# Patient Record
Sex: Male | Born: 1990 | Race: Black or African American | Hispanic: No | Marital: Single | Smoking: Never smoker
Health system: Southern US, Community
[De-identification: ages and names within clinical notes are randomized; demographics above are authoritative.]

---

## 2021-10-15 ENCOUNTER — Inpatient Hospital Stay
Admission: EM | Admit: 2021-10-15 | Discharge: 2021-10-18 | DRG: 638 | Disposition: A | Discharge status: Home / Self Care | Payer: Self-pay | Attending: Internal Medicine | Admitting: Internal Medicine

## 2021-10-15 ENCOUNTER — Emergency Department: Payer: Self-pay

## 2021-10-15 ENCOUNTER — Encounter: Payer: Self-pay | Admitting: Emergency Medicine

## 2021-10-15 ENCOUNTER — Other Ambulatory Visit: Payer: Self-pay

## 2021-10-15 DIAGNOSIS — E669 Obesity, unspecified: Secondary | ICD-10-CM | POA: Diagnosis present

## 2021-10-15 DIAGNOSIS — N179 Acute kidney failure, unspecified: Secondary | ICD-10-CM | POA: Diagnosis present

## 2021-10-15 DIAGNOSIS — Z20822 Contact with and (suspected) exposure to covid-19: Secondary | ICD-10-CM | POA: Diagnosis present

## 2021-10-15 DIAGNOSIS — Z6838 Body mass index (BMI) 38.0-38.9, adult: Secondary | ICD-10-CM

## 2021-10-15 DIAGNOSIS — R112 Nausea with vomiting, unspecified: Secondary | ICD-10-CM

## 2021-10-15 DIAGNOSIS — D72829 Elevated white blood cell count, unspecified: Secondary | ICD-10-CM

## 2021-10-15 DIAGNOSIS — R1013 Epigastric pain: Secondary | ICD-10-CM

## 2021-10-15 DIAGNOSIS — E1110 Type 2 diabetes mellitus with ketoacidosis without coma: Principal | ICD-10-CM | POA: Diagnosis present

## 2021-10-15 DIAGNOSIS — E860 Dehydration: Secondary | ICD-10-CM

## 2021-10-15 DIAGNOSIS — E1310 Other specified diabetes mellitus with ketoacidosis without coma: Secondary | ICD-10-CM

## 2021-10-15 DIAGNOSIS — K5900 Constipation, unspecified: Secondary | ICD-10-CM

## 2021-10-15 LAB — BLOOD GAS, VENOUS
Acid-base deficit: 18.3 mmol/L — ABNORMAL HIGH (ref 0.0–2.0)
Bicarbonate: 9.8 mmol/L — ABNORMAL LOW (ref 20.0–28.0)
O2 Saturation: 52.6 %
Patient temperature: 37
pCO2, Ven: 30 mmHg — ABNORMAL LOW (ref 44–60)
pH, Ven: 7.12 — CL (ref 7.25–7.43)
pO2, Ven: 32 mmHg (ref 32–45)

## 2021-10-15 LAB — MAGNESIUM: Magnesium: 2.3 mg/dL (ref 1.7–2.4)

## 2021-10-15 LAB — URINALYSIS, ROUTINE W REFLEX MICROSCOPIC
Bacteria, UA: NONE SEEN
Bilirubin Urine: NEGATIVE
Glucose, UA: >=500 mg/dL — AB
Ketones, ur: 80 mg/dL — AB
Leukocytes,Ua: NEGATIVE
Nitrite: NEGATIVE
Protein, ur: 100 mg/dL — AB
Specific Gravity, Urine: 1.026 (ref 1.005–1.030)
Squamous Epithelial / HPF: NONE SEEN (ref 0–5)
pH: 5 (ref 5.0–8.0)

## 2021-10-15 LAB — COMPREHENSIVE METABOLIC PANEL
ALT: 26 U/L (ref 0–44)
AST: 21 U/L (ref 15–41)
Albumin: 4.1 g/dL (ref 3.5–5.0)
Alkaline Phosphatase: 94 U/L (ref 38–126)
Anion gap: 20 — ABNORMAL HIGH (ref 5–15)
BUN: 14 mg/dL (ref 6–20)
CO2: 11 mmol/L — ABNORMAL LOW (ref 22–32)
Calcium: 8.6 mg/dL — ABNORMAL LOW (ref 8.9–10.3)
Chloride: 100 mmol/L (ref 98–111)
Creatinine, Ser: 1.54 mg/dL — ABNORMAL HIGH (ref 0.61–1.24)
GFR, Estimated: >60 mL/min (ref >60)
Glucose, Bld: 352 mg/dL — ABNORMAL HIGH (ref 70–99)
Potassium: 4.6 mmol/L (ref 3.5–5.1)
Sodium: 131 mmol/L — ABNORMAL LOW (ref 135–145)
Total Bilirubin: 1.9 mg/dL — ABNORMAL HIGH (ref 0.3–1.2)
Total Protein: 7.9 g/dL (ref 6.5–8.1)

## 2021-10-15 LAB — CBC
HCT: 47.2 % (ref 39.0–52.0)
Hemoglobin: 15.7 g/dL (ref 13.0–17.0)
MCH: 28 pg (ref 26.0–34.0)
MCHC: 33.3 g/dL (ref 30.0–36.0)
MCV: 84.1 fL (ref 80.0–100.0)
Platelets: 295 10*3/uL (ref 150–400)
RBC: 5.61 MIL/uL (ref 4.22–5.81)
RDW: 13.1 % (ref 11.5–15.5)
WBC: 10.7 10*3/uL — ABNORMAL HIGH (ref 4.0–10.5)
nRBC: 0 % (ref 0.0–0.2)

## 2021-10-15 LAB — RESP PANEL BY RT-PCR (FLU A&B, COVID) ARPGX2
Influenza A by PCR: NEGATIVE
Influenza B by PCR: NEGATIVE
SARS Coronavirus 2 by RT PCR: NEGATIVE

## 2021-10-15 LAB — LIPASE, BLOOD: Lipase: 27 U/L (ref 11–51)

## 2021-10-15 LAB — BETA-HYDROXYBUTYRIC ACID: Beta-Hydroxybutyric Acid: >8 mmol/L — ABNORMAL HIGH (ref 0.05–0.27)

## 2021-10-15 LAB — CBG MONITORING, ED: Glucose-Capillary: 85 mg/dL (ref 70–99)

## 2021-10-15 MED ORDER — SODIUM CHLORIDE 0.9 % IV SOLN: INTRAVENOUS | Status: DC

## 2021-10-15 MED ORDER — ACETAMINOPHEN 325 MG PO TABS: 650.0000 mg | ORAL_TABLET | Freq: Four times a day (QID) | ORAL | Status: DC | PRN

## 2021-10-15 MED ORDER — DEXTROSE 50 % IV SOLN: 0.0000 mL | INTRAVENOUS | Status: DC | PRN

## 2021-10-15 MED ORDER — ACETAMINOPHEN 650 MG RE SUPP: 650.0000 mg | Freq: Four times a day (QID) | RECTAL | Status: DC | PRN

## 2021-10-15 MED ORDER — NALOXONE HCL 2 MG/2ML IJ SOSY
0.4000 mg | PREFILLED_SYRINGE | INTRAMUSCULAR | Status: DC | PRN
  Filled 2021-10-15: qty 2

## 2021-10-15 MED ORDER — FENTANYL CITRATE PF 50 MCG/ML IJ SOSY
25.0000 ug | PREFILLED_SYRINGE | INTRAMUSCULAR | Status: DC | PRN
  Administered 2021-10-16 (×2): 25 ug via INTRAVENOUS
  Filled 2021-10-15 (×2): qty 1

## 2021-10-15 MED ORDER — POTASSIUM CHLORIDE 10 MEQ/100ML IV SOLN
10.0000 meq | INTRAVENOUS | Status: AC
  Administered 2021-10-15 – 2021-10-16 (×2): 10 meq via INTRAVENOUS
  Filled 2021-10-15 (×2): qty 100

## 2021-10-15 MED ORDER — SODIUM CHLORIDE 0.9 % IV BOLUS
1000.0000 mL | INTRAVENOUS | Status: AC
  Administered 2021-10-15: 1000 mL via INTRAVENOUS

## 2021-10-15 MED ORDER — SODIUM CHLORIDE 0.9 % IV BOLUS
1000.0000 mL | Freq: Once | INTRAVENOUS | Status: AC
  Administered 2021-10-15: 1000 mL via INTRAVENOUS

## 2021-10-15 MED ORDER — ONDANSETRON HCL 4 MG/2ML IJ SOLN
4.0000 mg | Freq: Four times a day (QID) | INTRAMUSCULAR | Status: DC | PRN
  Administered 2021-10-16 (×2): 4 mg via INTRAVENOUS
  Filled 2021-10-15 (×2): qty 2

## 2021-10-15 MED ORDER — DEXTROSE IN LACTATED RINGERS 5 % IV SOLN: INTRAVENOUS | Status: DC

## 2021-10-15 MED ORDER — INSULIN REGULAR(HUMAN) IN NACL 100-0.9 UT/100ML-% IV SOLN
INTRAVENOUS | Status: DC
  Administered 2021-10-15: 5.5 [IU]/h via INTRAVENOUS
  Filled 2021-10-15: qty 100

## 2021-10-15 NOTE — ED Provider Notes (Signed)
[facility] Provider Note    Event Date/Time   First MD Initiated Contact with Patient 10/15/21 2136     (approximate)   History   Emesis and Abdominal Pain   HPI  Mark Mayer is a 31 y.o. male reports no significant past medical history   Reports no primary care doctor or recent hospitalizations.  Takes no medications does not use alcohol or smoke  For about 5 to 6 days he has been having nausea, also very thirsty urinating frequently.  Reports moderate pain but severe nausea in his abdomen primary in the upper to right upper abdomen  No chest pain no trouble breathing.  Does feel fatigued lightheaded feels like his arms and legs feel heavy in general and dehydrated very thirsty  Urinating lots but no pain or discomfort with urination  No fever  No headache  Physical Exam   Triage Vital Signs: ED Triage Vitals  Enc Vitals Group     BP 10/15/21 1933 140/88     Pulse Rate 10/15/21 1933 (!) 121     Resp 10/15/21 1933 20     Temp 10/15/21 1933 98.7 F (37.1 C)     Temp Source 10/15/21 1933 Oral     SpO2 10/15/21 1929 100 %     Weight 10/15/21 1930 240 lb (108.9 kg)     Height 10/15/21 1930 5\' 6"  (1.676 m)     Head Circumference --      Peak Flow --      Pain Score 10/15/21 1930 7     Pain Loc --      Pain Edu? --      Excl. in GC? --     Most recent vital signs: Vitals:   10/15/21 1933 10/15/21 2230  BP: 140/88 (!) 137/113  Pulse: (!) 121 (!) 122  Resp: 20   Temp: 98.7 F (37.1 C)   SpO2: 97% 98%     General: Awake, no distress.  Appears mildly ill.  Somewhat tachycardic, slightly diaphoretic.  Mucous membranes dry CV:  Good peripheral perfusion.  Tachycardia.  Normal heart tones Resp:  Normal effort.  No noted tachypnea.  No clues [place] breathing Abd:  No distention.  reorts moderate pain to palpation epigastrium right upper quadrant mild pain in the right lower quadrant.  Denies pain across the left side of the  abdomen Other:  Dry mucous membranes.  Fully alert well oriented.  Moves all extremities well follows commands without difficulty.   ED Results / Procedures / Treatments   Labs (all labs ordered are listed, but only abnormal results are displayed) Labs Reviewed  COMPREHENSIVE METABOLIC PANEL - Abnormal; Notable for the following components:      Result Value   Sodium 131 (*)    CO2 11 (*)    Glucose, Bld 352 (*)    Creatinine, Ser 1.54 (*)    Calcium 8.6 (*)    Total Bilirubin 1.9 (*)    Anion gap 20 (*)    All other components within normal limits  CBC - Abnormal; Notable for the following components:   WBC 10.7 (*)    All other components within normal limits  URINALYSIS, ROUTINE W REFLEX MICROSCOPIC - Abnormal; Notable for the following components:   Color, Urine STRAW (*)    APPearance CLEAR (*)    Glucose, UA >=500 (*)    Hgb urine dipstick MODERATE (*)    Ketones, ur 80 (*)    Protein, ur 100 (*)  All other components within normal limits  BLOOD GAS, VENOUS - Abnormal; Notable for the following components:   pH, Ven 7.12 (*)    pCO2, Ven 30 (*)    Bicarbonate 9.8 (*)    Acid-base deficit 18.3 (*)    All other components within normal limits  BETA-HYDROXYBUTYRIC ACID - Abnormal; Notable for the following components:   Beta-Hydroxybutyric Acid >8.00 (*)    All other components within normal limits  RESP PANEL BY RT-PCR (FLU A&B, COVID) ARPGX2  LIPASE, BLOOD  HIV ANTIBODY (ROUTINE TESTING W REFLEX)  MAGNESIUM  MAGNESIUM  COMPREHENSIVE METABOLIC PANEL  CBC WITH DIFFERENTIAL/PLATELET  PHOSPHORUS  BASIC METABOLIC PANEL  BASIC METABOLIC PANEL  CBG MONITORING, ED   Labs are personally reviewed and interpreted by me, notable for significant acidosis reduced CO2 elevated glucose elevated ketones.  Findings concerning in keeping with likely acute new onset diabetic ketoacidosis   RADIOLOGY  Personally viewed and interpreted the patient's CT abdomen for gross  pathology.  I do not see evidence of acute gross abnormality.  Refer to radiology for more detailed report     PROCEDURES:  Critical Care performed: Yes, see critical care procedure note(s)  Procedures  CRITICAL CARE Performed by: Sharyn Creamer   Total critical care time: 30 minutes  Critical care time was exclusive of separately billable procedures and treating other patients.  Critical care was necessary to treat or prevent imminent or life-threatening deterioration.  Critical care was time spent personally by me on the following activities: development of treatment plan with patient and/or surrogate as well as nursing, discussions with consultants, evaluation of patient's response to treatment, examination of patient, obtaining history from patient or surrogate, ordering and performing treatments and interventions, ordering and review of laboratory studies, ordering and review of radiographic studies, pulse oximetry and re-evaluation of patient's condition.   MEDICATIONS ORDERED IN ED: Medications  insulin regular, human (MYXREDLIN) 100 units/ 100 mL infusion (has no administration in time range)  dextrose 5 % in lactated ringers infusion (has no administration in time range)  dextrose 50 % solution 0-50 mL (has no administration in time range)  sodium chloride 0.9 % bolus 1,000 mL (has no administration in time range)  0.9 %  sodium chloride infusion (has no administration in time range)  potassium chloride 10 mEq in 100 mL IVPB (has no administration in time range)  acetaminophen (TYLENOL) tablet 650 mg (has no administration in time range)    Or  acetaminophen (TYLENOL) suppository 650 mg (has no administration in time range)  naloxone (NARCAN) injection 0.4 mg (has no administration in time range)  fentaNYL (SUBLIMAZE) injection 25 mcg (has no administration in time range)  ondansetron (ZOFRAN) injection 4 mg (has no administration in time range)  sodium chloride 0.9 % bolus  1,000 mL (1,000 mLs Intravenous New Bag/Given 10/15/21 2218)     IMPRESSION / MDM / ASSESSMENT AND PLAN / ED COURSE  I reviewed the triage vital signs and the nursing notes.                              Differential diagnosis includes, but is not limited to, new onset diabetic ketoacidosis based on his labs and clinical history.  Abdominal pain may be referred secondary to his DKA, however given his presentation do wish to exclude acute pathologies.  Given his elevated creatinine and concern for DKA and dehydration will avoid nephrotoxic agents at this time.  CT  scan without contrast shows no acute finding.  Patient feeling improved after receiving fluids, will start on DKA protocol/Endo tool  Admission discussed with Dr. Marilynn Rail.   The patient is on the cardiac monitor to evaluate for evidence of arrhythmia and/or significant heart rate changes.  ----------------------------------------- 11:11 PM on 10/15/2021 ----------------------------------------- Patient understanding and agreeable with plan for admission to hospital.  Started to feel improved after receiving fluids, starting on insulin infusion by protocol.       FINAL CLINICAL IMPRESSION(S) / ED DIAGNOSES   Final diagnoses:  Diabetic ketoacidosis without coma associated with other specified diabetes mellitus (HCC)     Rx / DC Orders   ED Discharge Orders     None        Note:  This document was prepared using Dragon voice recognition software and may include unintentional dictation errors.   Sharyn Creamer, MD 10/15/21 [phone]

## 2021-10-15 NOTE — ED Triage Notes (Signed)
Pt presents tonight with complaints of N/V x 3 days with associated RUQ pain that radiates to the left side of his stomach. He states that he hasn't had a BM in almost a week and endorses fullness in his abdomen. Pt received 4mg  of zofran & of NS with EMS en route. Denies SOB or CP.

## 2021-10-15 NOTE — ED Notes (Signed)
Iv fluids infusing.  Pt moved to room 5.  Family with pt.

## 2021-10-15 NOTE — ED Notes (Signed)
Md in with pt and family 

## 2021-10-16 ENCOUNTER — Observation Stay: Payer: Self-pay

## 2021-10-16 ENCOUNTER — Encounter: Payer: Self-pay | Admitting: Internal Medicine

## 2021-10-16 DIAGNOSIS — R112 Nausea with vomiting, unspecified: Secondary | ICD-10-CM | POA: Diagnosis present

## 2021-10-16 DIAGNOSIS — D72829 Elevated white blood cell count, unspecified: Secondary | ICD-10-CM | POA: Diagnosis present

## 2021-10-16 DIAGNOSIS — E860 Dehydration: Secondary | ICD-10-CM | POA: Diagnosis present

## 2021-10-16 DIAGNOSIS — R1013 Epigastric pain: Secondary | ICD-10-CM | POA: Diagnosis present

## 2021-10-16 DIAGNOSIS — N179 Acute kidney failure, unspecified: Secondary | ICD-10-CM | POA: Diagnosis present

## 2021-10-16 LAB — BASIC METABOLIC PANEL
Anion gap: 11 (ref 5–15)
Anion gap: 13 (ref 5–15)
Anion gap: 23 — ABNORMAL HIGH (ref 5–15)
BUN: 10 mg/dL (ref 6–20)
BUN: 11 mg/dL (ref 6–20)
BUN: 13 mg/dL (ref 6–20)
CO2: 11 mmol/L — ABNORMAL LOW (ref 22–32)
CO2: 13 mmol/L — ABNORMAL LOW (ref 22–32)
CO2: 8 mmol/L — ABNORMAL LOW (ref 22–32)
Calcium: 8.4 mg/dL — ABNORMAL LOW (ref 8.9–10.3)
Calcium: 8.5 mg/dL — ABNORMAL LOW (ref 8.9–10.3)
Calcium: 8.6 mg/dL — ABNORMAL LOW (ref 8.9–10.3)
Chloride: 102 mmol/L (ref 98–111)
Chloride: 107 mmol/L (ref 98–111)
Chloride: 109 mmol/L (ref 98–111)
Creatinine, Ser: 1.42 mg/dL — ABNORMAL HIGH (ref 0.61–1.24)
Creatinine, Ser: 1.44 mg/dL — ABNORMAL HIGH (ref 0.61–1.24)
Creatinine, Ser: 1.49 mg/dL — ABNORMAL HIGH (ref 0.61–1.24)
GFR, Estimated: >60 mL/min (ref >60)
GFR, Estimated: >60 mL/min (ref >60)
GFR, Estimated: >60 mL/min (ref >60)
Glucose, Bld: 209 mg/dL — ABNORMAL HIGH (ref 70–99)
Glucose, Bld: 266 mg/dL — ABNORMAL HIGH (ref 70–99)
Glucose, Bld: 303 mg/dL — ABNORMAL HIGH (ref 70–99)
Potassium: 4 mmol/L (ref 3.5–5.1)
Potassium: 4.1 mmol/L (ref 3.5–5.1)
Potassium: 4.8 mmol/L (ref 3.5–5.1)
Sodium: 131 mmol/L — ABNORMAL LOW (ref 135–145)
Sodium: 133 mmol/L — ABNORMAL LOW (ref 135–145)
Sodium: 133 mmol/L — ABNORMAL LOW (ref 135–145)

## 2021-10-16 LAB — COMPREHENSIVE METABOLIC PANEL
ALT: 23 U/L (ref 0–44)
AST: 16 U/L (ref 15–41)
Albumin: 3.8 g/dL (ref 3.5–5.0)
Alkaline Phosphatase: 87 U/L (ref 38–126)
Anion gap: 17 — ABNORMAL HIGH (ref 5–15)
BUN: 12 mg/dL (ref 6–20)
CO2: 10 mmol/L — ABNORMAL LOW (ref 22–32)
Calcium: 8.2 mg/dL — ABNORMAL LOW (ref 8.9–10.3)
Chloride: 109 mmol/L (ref 98–111)
Creatinine, Ser: 1.46 mg/dL — ABNORMAL HIGH (ref 0.61–1.24)
GFR, Estimated: >60 mL/min (ref >60)
Glucose, Bld: 230 mg/dL — ABNORMAL HIGH (ref 70–99)
Potassium: 4.7 mmol/L (ref 3.5–5.1)
Sodium: 136 mmol/L (ref 135–145)
Total Bilirubin: 1 mg/dL (ref 0.3–1.2)
Total Protein: 7.8 g/dL (ref 6.5–8.1)

## 2021-10-16 LAB — CBG MONITORING, ED
Glucose-Capillary: 180 mg/dL — ABNORMAL HIGH (ref 70–99)
Glucose-Capillary: 190 mg/dL — ABNORMAL HIGH (ref 70–99)
Glucose-Capillary: 201 mg/dL — ABNORMAL HIGH (ref 70–99)
Glucose-Capillary: 208 mg/dL — ABNORMAL HIGH (ref 70–99)
Glucose-Capillary: 208 mg/dL — ABNORMAL HIGH (ref 70–99)
Glucose-Capillary: 236 mg/dL — ABNORMAL HIGH (ref 70–99)
Glucose-Capillary: 239 mg/dL — ABNORMAL HIGH (ref 70–99)
Glucose-Capillary: 253 mg/dL — ABNORMAL HIGH (ref 70–99)
Glucose-Capillary: 255 mg/dL — ABNORMAL HIGH (ref 70–99)
Glucose-Capillary: 265 mg/dL — ABNORMAL HIGH (ref 70–99)
Glucose-Capillary: 276 mg/dL — ABNORMAL HIGH (ref 70–99)

## 2021-10-16 LAB — CBC WITH DIFFERENTIAL/PLATELET
Abs Immature Granulocytes: 0.18 10*3/uL — ABNORMAL HIGH (ref 0.00–0.07)
Basophils Absolute: 0 10*3/uL (ref 0.0–0.1)
Basophils Relative: 0 %
Eosinophils Absolute: 0 10*3/uL (ref 0.0–0.5)
Eosinophils Relative: 0 %
HCT: 44.5 % (ref 39.0–52.0)
Hemoglobin: 14.7 g/dL (ref 13.0–17.0)
Immature Granulocytes: 1 %
Lymphocytes Relative: 17 %
Lymphs Abs: 2.3 10*3/uL (ref 0.7–4.0)
MCH: 27.8 pg (ref 26.0–34.0)
MCHC: 33 g/dL (ref 30.0–36.0)
MCV: 84.3 fL (ref 80.0–100.0)
Monocytes Absolute: 1 10*3/uL (ref 0.1–1.0)
Monocytes Relative: 7 %
Neutro Abs: 10.1 10*3/uL — ABNORMAL HIGH (ref 1.7–7.7)
Neutrophils Relative %: 75 %
Platelets: 261 10*3/uL (ref 150–400)
RBC: 5.28 MIL/uL (ref 4.22–5.81)
RDW: 13.3 % (ref 11.5–15.5)
WBC: 13.6 10*3/uL — ABNORMAL HIGH (ref 4.0–10.5)
nRBC: 0 % (ref 0.0–0.2)

## 2021-10-16 LAB — URINE DRUG SCREEN, QUALITATIVE (ARMC ONLY)
Amphetamines, Ur Screen: NOT DETECTED
Barbiturates, Ur Screen: NOT DETECTED
Benzodiazepine, Ur Scrn: NOT DETECTED
Cannabinoid 50 Ng, Ur ~~LOC~~: NOT DETECTED
Cocaine Metabolite,Ur ~~LOC~~: NOT DETECTED
MDMA (Ecstasy)Ur Screen: NOT DETECTED
Methadone Scn, Ur: NOT DETECTED
Opiate, Ur Screen: NOT DETECTED
Phencyclidine (PCP) Ur S: NOT DETECTED
Tricyclic, Ur Screen: NOT DETECTED

## 2021-10-16 LAB — HIV ANTIBODY (ROUTINE TESTING W REFLEX): HIV Screen 4th Generation wRfx: NONREACTIVE

## 2021-10-16 LAB — MAGNESIUM: Magnesium: 2.2 mg/dL (ref 1.7–2.4)

## 2021-10-16 LAB — PHOSPHORUS: Phosphorus: 2.3 mg/dL — ABNORMAL LOW (ref 2.5–4.6)

## 2021-10-16 LAB — GLUCOSE, CAPILLARY
Glucose-Capillary: 231 mg/dL — ABNORMAL HIGH (ref 70–99)
Glucose-Capillary: 345 mg/dL — ABNORMAL HIGH (ref 70–99)

## 2021-10-16 LAB — CREATININE, URINE, RANDOM: Creatinine, Urine: 57 mg/dL

## 2021-10-16 LAB — SODIUM, URINE, RANDOM: Sodium, Ur: 68 mmol/L

## 2021-10-16 MED ORDER — PROMETHAZINE HCL 25 MG/ML IJ SOLN
12.5000 mg | Freq: Four times a day (QID) | INTRAMUSCULAR | Status: DC | PRN
  Administered 2021-10-16: 12.5 mg via INTRAVENOUS
  Filled 2021-10-16 (×3): qty 0.5

## 2021-10-16 MED ORDER — KCL IN DEXTROSE-NACL 10-5-0.45 MEQ/L-%-% IV SOLN
INTRAVENOUS | Status: DC
Start: 2021-10-16 — End: 2021-10-16
  Filled 2021-10-16 (×4): qty 1000

## 2021-10-16 MED ORDER — INSULIN GLARGINE-YFGN 100 UNIT/ML ~~LOC~~ SOLN
15.0000 [IU] | Freq: Every day | SUBCUTANEOUS | Status: DC
  Administered 2021-10-16: 15 [IU] via SUBCUTANEOUS
  Filled 2021-10-16 (×2): qty 0.15

## 2021-10-16 MED ORDER — INSULIN ASPART 100 UNIT/ML IJ SOLN
0.0000 [IU] | Freq: Every day | INTRAMUSCULAR | Status: DC
  Administered 2021-10-16: 21:00:00 4 [IU] via SUBCUTANEOUS
  Filled 2021-10-16: qty 1

## 2021-10-16 MED ORDER — METOCLOPRAMIDE HCL 5 MG/ML IJ SOLN
10.0000 mg | Freq: Three times a day (TID) | INTRAMUSCULAR | Status: DC
  Administered 2021-10-16 – 2021-10-18 (×7): 10 mg via INTRAVENOUS
  Filled 2021-10-16 (×7): qty 2

## 2021-10-16 MED ORDER — INSULIN STARTER KIT- PEN NEEDLES (ENGLISH)
1.0000 | Freq: Once | Status: DC
  Filled 2021-10-16: qty 1

## 2021-10-16 MED ORDER — LIVING WELL WITH DIABETES BOOK
Freq: Once | Status: DC
  Filled 2021-10-16: qty 1

## 2021-10-16 MED ORDER — POTASSIUM CHLORIDE 2 MEQ/ML IV SOLN
INTRAVENOUS | Status: DC
  Filled 2021-10-16 (×7): qty 1000

## 2021-10-16 MED ORDER — INSULIN GLARGINE-YFGN 100 UNIT/ML ~~LOC~~ SOLN
5.0000 [IU] | Freq: Once | SUBCUTANEOUS | Status: AC
  Administered 2021-10-16: 5 [IU] via SUBCUTANEOUS
  Filled 2021-10-16: qty 0.05

## 2021-10-16 MED ORDER — INSULIN ASPART 100 UNIT/ML IJ SOLN
0.0000 [IU] | Freq: Three times a day (TID) | INTRAMUSCULAR | Status: DC
  Administered 2021-10-16 (×2): 5 [IU] via SUBCUTANEOUS
  Administered 2021-10-17 (×2): 11 [IU] via SUBCUTANEOUS
  Filled 2021-10-16 (×4): qty 1

## 2021-10-16 MED ORDER — POTASSIUM CHLORIDE 2 MEQ/ML IV SOLN
INTRAVENOUS | Status: DC
  Filled 2021-10-16 (×4): qty 1000

## 2021-10-16 NOTE — Progress Notes (Signed)
PROGRESS NOTE    Mark Mayer  [id number] DOB: July 30, 1991 DOA: 10/15/2021 PCP: Pcp, No    Brief Narrative:  31 y.o. male who denies any known prior medical history , who is admitted to [facility] on 10/15/2021 with DKA after presenting from home to [facility] ED complaining of nausea and vomiting.    In the absence of any known history of underlying diabetes, the patient presents with 3 to 4 days of nausea resulting in 2-3 daily episodes of nonbloody, nonbilious emesis over that timeframe, with most recent episode of emesis occurring just prior to presenting to [facility] ED today.  As a consequence, he notes very little p.o. intake over the last few days.  He also notes recent new onset polydipsia and polyuria.  Denies any associated dysuria, gross hematuria.   patient also reports 3 to 4 days progressive epigastric discomfort.  Describes this pain as sharp in nature, with radiation into the right upper abdominal quadrant.  Pain is intermittent in nature, with exacerbation with palpation over the abdomen.  Denies any preceding trauma.  Patient found to be in diabetic ketoacidosis.  Started on insulin GTT.  Anion gap closed.  Patient remains somewhat acidotic but is mentating clearly and otherwise stable.  Nausea vomiting and abdominal pain has resolved.  Tolerating p.o. intake.  Transition to subcutaneous insulin regimen.  Diabetes coordinator following.  Assessment & Plan:   Principal Problem:   DKA (diabetic ketoacidosis) ([place]) Active Problems:   Dehydration   AKI (acute kidney injury) ([place])   Epigastric pain   Nausea & vomiting   Leukocytosis  Diabetic ketoacidosis New onset diabetes mellitus Initially required insulin gtt., now weaned off Transition to subcutaneous regimen Nausea vomiting and abdominal pain resolved Tolerating p.o. intake Plan: Semglee 20 units daily Moderate sliding scale Nightly coverage Carb modified diet Diabetes coordinator consult No IV fluids if tolerating  p.o.  Acute kidney injury Suspected dehydration in setting of DKA No point of comparison Plan: Encourage p.o. intake Daily BMP  Intractable nausea and vomiting Suspect secondary to diabetic ketoacidosis Now resolved Plan: Carb modified diet as tolerated As needed antiemetics  Class II obesity BMI 38 This complicates overall care and prognosis Plan: DM coordinator and RD consult     DVT prophylaxis: SCDs Code Status: Full Family Communication: Mother at bedside 2/23 Disposition Plan: Status is: Inpatient Remains inpatient appropriate because: Diabetic ketoacidosis.  New onset diabetes mellitus.           Level of care: Med-Surg  Consultants:  None  Procedures:  None  Antimicrobials: None   Subjective: Seen and examined.  Some lethargic but does answer questions appropriately.  Objective: Vitals:   10/16/21 1130 10/16/21 1200 10/16/21 1230 10/16/21 1330  BP: 122/78 115/76 (!) 138/93 132/82  Pulse:      Resp:      Temp:      TempSrc:      SpO2:      Weight:      Height:        Intake/Output Summary (Last 24 hours) at 10/16/2021 1344 Last data filed at 10/16/2021 0706 Gross per 24 hour  Intake 2239.49 ml  Output 900 ml  Net 1339.49 ml   Filed Weights   10/15/21 1930  Weight: 108.9 kg    Examination:  General exam: No acute distress.  Somewhat lethargic Respiratory system: Clear to auscultation. Respiratory effort normal. Cardiovascular system: S1-S2, RRR, no murmurs, no pedal edema Gastrointestinal system: Obese, soft, NT/ND, normal bowel sounds Central nervous system: Alert  and oriented. No focal neurological deficits. Extremities: Symmetric 5 x 5 power. Skin: No rashes, lesions or ulcers Psychiatry: Judgement and insight appear normal. Mood & affect flattened.     Data Reviewed: I have personally reviewed following labs and imaging studies  CBC: Recent Labs  Lab 10/15/21 1936 10/16/21 0355  WBC 10.7* 13.6*  NEUTROABS  --   10.1*  HGB 15.7 14.7  HCT 47.2 44.5  MCV 84.1 84.3  PLT 295 0000000   Basic Metabolic Panel: Recent Labs  Lab 10/15/21 1936 10/15/21 2342 10/16/21 0355 10/16/21 0830  NA 131* 133* 136 133*  K 4.6 4.8 4.7 4.0  CL 100 102 109 109  CO2 11* 8* 10* 11*  GLUCOSE 352* 303* 230* 209*  BUN 14 13 12 11   CREATININE 1.54* 1.42* 1.46* 1.49*  CALCIUM 8.6* 8.5* 8.2* 8.4*  MG 2.3  --  2.2  --   PHOS  --   --  2.3*  --    GFR: Estimated Creatinine Clearance: 85.4 mL/min (A) (by C-G formula based on SCr of 1.49 mg/dL (H)). Liver Function Tests: Recent Labs  Lab 10/15/21 1936 10/16/21 0355  AST 21 16  ALT 26 23  ALKPHOS 94 87  BILITOT 1.9* 1.0  PROT 7.9 7.8  ALBUMIN 4.1 3.8   Recent Labs  Lab 10/15/21 1936  LIPASE 27   No results for input(s): AMMONIA in the last 168 hours. Coagulation Profile: No results for input(s): INR, PROTIME in the last 168 hours. Cardiac Enzymes: No results for input(s): CKTOTAL, CKMB, CKMBINDEX, TROPONINI in the last 168 hours. BNP (last 3 results) No results for input(s): PROBNP in the last 8760 hours. HbA1C: No results for input(s): HGBA1C in the last 72 hours. CBG: Recent Labs  Lab 10/16/21 0559 10/16/21 0743 10/16/21 0857 10/16/21 1054 10/16/21 1247  GLUCAP 255* 201* 190* 265* 239*   Lipid Profile: No results for input(s): CHOL, HDL, LDLCALC, TRIG, CHOLHDL, LDLDIRECT in the last 72 hours. Thyroid Function Tests: No results for input(s): TSH, T4TOTAL, FREET4, T3FREE, THYROIDAB in the last 72 hours. Anemia Panel: No results for input(s): VITAMINB12, FOLATE, FERRITIN, TIBC, IRON, RETICCTPCT in the last 72 hours. Sepsis Labs: No results for input(s): PROCALCITON, LATICACIDVEN in the last 168 hours.  Recent Results (from the past 240 hour(s))  Resp Panel by RT-PCR (Flu A&B, Covid) Nasopharyngeal Swab     Status: None   Collection Time: 10/15/21  7:36 PM   Specimen: Nasopharyngeal Swab; Nasopharyngeal(NP) swabs in vial transport medium   Result Value Ref Range Status   SARS Coronavirus 2 by RT PCR NEGATIVE NEGATIVE Final    Comment: (NOTE) SARS-CoV-2 target nucleic acids are NOT DETECTED.  The SARS-CoV-2 RNA is generally detectable in upper respiratory specimens during the acute phase of infection. The lowest concentration of SARS-CoV-2 viral copies this assay can detect is 138 copies/mL. A negative result does not preclude SARS-Cov-2 infection and should not be used as the sole basis for treatment or other patient management decisions. A negative result may occur with  improper specimen collection/handling, submission of specimen other than nasopharyngeal swab, presence of viral mutation(s) within the areas targeted by this assay, and inadequate number of viral copies(<138 copies/mL). A negative result must be combined with clinical observations, patient history, and epidemiological information. The expected result is Negative.  Fact Sheet for Patients:  EntrepreneurPulse.com.au  Fact Sheet for Healthcare Providers:  IncredibleEmployment.be  This test is no t yet approved or cleared by the [place] FDA and  has  been authorized for detection and/or diagnosis of SARS-CoV-2 by FDA under an Emergency Use Authorization (EUA). This EUA will remain  in effect (meaning this test can be used) for the duration of the COVID-19 declaration under Section 564(b)(1) of the Act, 21 U.S.C.section 360bbb-3(b)(1), unless the authorization is terminated  or revoked sooner.       Influenza A by PCR NEGATIVE NEGATIVE Final   Influenza B by PCR NEGATIVE NEGATIVE Final    Comment: (NOTE) The Xpert Xpress SARS-CoV-2/FLU/RSV plus assay is intended as an aid in the diagnosis of influenza from Nasopharyngeal swab specimens and should not be used as a sole basis for treatment. Nasal washings and aspirates are unacceptable for Xpert Xpress SARS-CoV-2/FLU/RSV testing.  Fact Sheet for  Patients: EntrepreneurPulse.com.au  Fact Sheet for Healthcare Providers: IncredibleEmployment.be  This test is not yet approved or cleared by the [place] FDA and has been authorized for detection and/or diagnosis of SARS-CoV-2 by FDA under an Emergency Use Authorization (EUA). This EUA will remain in effect (meaning this test can be used) for the duration of the COVID-19 declaration under Section 564(b)(1) of the Act, 21 U.S.C. section 360bbb-3(b)(1), unless the authorization is terminated or revoked.  Performed at [facility], [address], [place], [place] [address]          Radiology Studies: CT ABDOMEN PELVIS WO CONTRAST  Result Date: 10/15/2021 CLINICAL DATA:  Right upper quadrant pain with nausea and vomiting for 3 days, initial encounter EXAM: CT ABDOMEN AND PELVIS WITHOUT CONTRAST TECHNIQUE: Multidetector CT imaging of the abdomen and pelvis was performed following the standard protocol without IV contrast. RADIATION DOSE REDUCTION: This exam was performed according to the departmental dose-optimization program which includes automated exposure control, adjustment of the mA and/or kV according to patient size and/or use of iterative reconstruction technique. COMPARISON:  None. FINDINGS: Lower chest: No acute abnormality. Hepatobiliary: No focal liver abnormality is seen. No gallstones, gallbladder wall thickening, or biliary dilatation. Pancreas: Unremarkable. No pancreatic ductal dilatation or surrounding inflammatory changes. Spleen: Normal in size without focal abnormality. Adrenals/Urinary Tract: Adrenal glands are within normal limits. Kidneys demonstrate a normal appearance without renal calculi or obstructive changes. The ureters are within normal limits. Bladder is partially distended. Stomach/Bowel: The appendix is within normal limits. No obstructive or inflammatory changes of the colon are noted. Small bowel and  stomach is within normal limits. Vascular/Lymphatic: No significant vascular findings are present. No enlarged abdominal or pelvic lymph nodes. Reproductive: Prostate is unremarkable. Other: No abdominal wall hernia or abnormality. No abdominopelvic ascites. Musculoskeletal: No acute or significant osseous findings. IMPRESSION: No acute abnormality noted. Electronically Signed   By: Inez Catalina M.D.   On: 10/15/2021 22:17   DG Chest Port 1 View  Result Date: 10/16/2021 CLINICAL DATA:  Diabetic ketoacidosis. Nausea and vomiting for 3 days. Right upper quadrant. EXAM: PORTABLE CHEST 1 VIEW COMPARISON:  None. FINDINGS: The cardiomediastinal contours are normal. The lungs are clear. Pulmonary vasculature is normal. No consolidation, pleural effusion, or pneumothorax. No acute osseous abnormalities are seen. IMPRESSION: Negative portable AP view of the chest. Electronically Signed   By: Keith Rake M.D.   On: 10/16/2021 01:49        Scheduled Meds:  insulin aspart  0-15 Units Subcutaneous TID WC   insulin aspart  0-5 Units Subcutaneous QHS   insulin glargine-yfgn  15 Units Subcutaneous Daily   insulin glargine-yfgn  5 Units Subcutaneous Once   Continuous Infusions:  promethazine (PHENERGAN) injection (IM or IVPB)  Stopped (10/16/21 1153)     LOS: 0 days      Sidney Ace, MD [facility]   If 7PM-7AM, please contact night-coverage  10/16/2021, 1:44 PM

## 2021-10-16 NOTE — ED Notes (Signed)
D5% solution has not arrived from pharmacy at this time.

## 2021-10-16 NOTE — H&P (Signed)
History and Physical    PLEASE NOTE THAT DRAGON DICTATION SOFTWARE WAS USED IN THE CONSTRUCTION OF THIS NOTE.   Felipa Emory TUU:[id number] DOB: 1991/03/22 DOA: 10/15/2021  PCP: Pcp, No (will further assess) Patient coming from: home   I have personally briefly reviewed patient's old medical records in [facility]  Chief Complaint: Nausea vomiting  HPI: Mark Mayer is a 31 y.o. male who denies any known prior medical history , who is admitted to [facility] on 10/15/2021 with DKA after presenting from home to [facility] ED complaining of nausea and vomiting.   In the absence of any known history of underlying diabetes, the patient presents with 3 to 4 days of nausea resulting in 2-3 daily episodes of nonbloody, nonbilious emesis over that timeframe, with most recent episode of emesis occurring just prior to presenting to [facility] ED today.  As a consequence, he notes very little p.o. intake over the last few days.  He also notes recent new onset polydipsia and polyuria.  Denies any associated dysuria, gross hematuria.  patient also reports 3 to 4 days progressive epigastric discomfort.  Describes this pain as sharp in nature, with radiation into the right upper abdominal quadrant.  Pain is intermittent in nature, with exacerbation with palpation over the abdomen.  Denies any preceding trauma.  Denies any recent subjective fever, chills, rigors, or generalized myalgias. Denies any recent headache, neck stiffness, rhinitis, rhinorrhea, sore throat, sob, wheezing, cough.  Denies any recent diarrhea, rash, melena, or hematochezia.  No recent traveling or known COVID-19 exposures.  Denies any recent chest pain, diaphoresis, palpitations, dizziness.  Denies any recent alcohol consumption or use of recreational drugs.   Denies any associated acute focal neurologic deficits, including no acute focal weakness, acute focal numbness, paresthesias, facial droop, dysarthria, expressive aphasia, acute change in  vision, acute change in hearing, dysphagia, vertigo.  While the patient denies any known personal history of diabetes, he reports a strong family history of diabetes, noting multiple primary family members with previously been diagnosed with diabetes.     ED Course:  Vital signs in the ED were notable for the following: Afebrile; initial heart rate 121, which is decreased to 117 following initiation of IV fluids; blood pressure 125/91; respiratory rate 20, oxygen saturation 97 to 100% on room air.  Labs were notable for the following: CBG 276; VBG 7.12/30; beta hydroxybutyric acid greater than 8.0.  CMP notable for the following: Sodium 131, which corrects to approximately 135 when taking into account concomitant hyperglycemia, potassium 4.6, bicarbonate 11, anion gap 20, creatinine 1.54 without prior serum creatinine data point available for point comparison, glucose 352, liver enzymes found to be within normal limits.  Lipase 27.  CBC notable for episode count 10,700.  Urinalysis notable for no white blood cells, no bacteria, 80 ketones, 100 protein, no red blood cells, and specific gravity of 1.026.  COVID-19/influenza PCR negative.  Imaging and additional notable ED work-up: CT abdomen/pelvis without contrast showed no evidence of acute intra-abdominal or acute intrapelvic process.  While in the ED, the following were administered: Insulin drip initiated; normal saline x2 L bolus followed by initiation continuous NS at 125 cc/h.  Potassium chloride 20 mEq IV over 2 hours x 1 dose.  Subsequently, the patient was admitted for overnight observation for DKA in the setting of new diagnosis of diabetes, complicated by presumed acute kidney injury.     Review of Systems: As per HPI otherwise 10 point review of systems negative.   History  reviewed. No pertinent past medical history.  History reviewed. No pertinent surgical history.  Social History:  reports that he has never smoked. He does  not have any smokeless tobacco history on file. He reports that he does not currently use alcohol. He reports that he does not use drugs.   Family history reviewed and not pertinent     Outpatient medications: Patient denies any use of prescription or over-the-counter medications or supplements.    Objective    Physical Exam: Vitals:   10/15/21 1930 10/15/21 1933 10/15/21 2230 10/15/21 2300  BP:  140/88 (!) 137/113 (!) 125/91  Pulse:  (!) 121 (!) 122 (!) 117  Resp:  20    Temp:  98.7 F (37.1 C)    TempSrc:  Oral    SpO2:  97% 98% 100%  Weight: 108.9 kg     Height: _0  (1.676 m)       General: appears to be stated age; alert, oriented Skin: warm, dry, no rash Head:  AT/[place] Mouth:  Oral mucosa membranes appear dry, normal dentition Neck: supple; trachea midline Heart:  RRR; did not appreciate any M/R/G Lungs: CTAB, did not appreciate any wheezes, rales, or rhonchi Abdomen: + BS; soft, ND; mild epigastric/right upper quadrant tenderness to palpation in the absence of any associated guarding, rigidity, or rebound tenderness. Vascular: 2+ pedal pulses b/l; 2+ radial pulses b/l Extremities: no peripheral edema, no muscle wasting Neuro: strength and sensation intact in upper and lower extremities b/l      Labs on Admission: I have personally reviewed following labs and imaging studies  CBC: Recent Labs  Lab 10/15/21 1936  WBC 10.7*  HGB 15.7  HCT 47.2  MCV 84.1  PLT 716   Basic Metabolic Panel: Recent Labs  Lab 10/15/21 1936 10/15/21 2342  NA 131* 133*  K 4.6 4.8  CL 100 102  CO2 11* 8*  GLUCOSE 352* 303*  BUN 14 13  CREATININE 1.54* 1.42*  CALCIUM 8.6* 8.5*  MG 2.3  --    GFR: Estimated Creatinine Clearance: 89.6 mL/min (A) (by C-G formula based on SCr of 1.42 mg/dL (H)). Liver Function Tests: Recent Labs  Lab 10/15/21 1936  AST 21  ALT 26  ALKPHOS 94  BILITOT 1.9*  PROT 7.9  ALBUMIN 4.1   Recent Labs  Lab 10/15/21 1936  LIPASE 27    No results for input(s): AMMONIA in the last 168 hours. Coagulation Profile: No results for input(s): INR, PROTIME in the last 168 hours. Cardiac Enzymes: No results for input(s): CKTOTAL, CKMB, CKMBINDEX, TROPONINI in the last 168 hours. BNP (last 3 results) No results for input(s): PROBNP in the last 8760 hours. HbA1C: No results for input(s): HGBA1C in the last 72 hours. CBG: Recent Labs  Lab 10/15/21 1936 10/15/21 2345  GLUCAP 85 276*   Lipid Profile: No results for input(s): CHOL, HDL, LDLCALC, TRIG, CHOLHDL, LDLDIRECT in the last 72 hours. Thyroid Function Tests: No results for input(s): TSH, T4TOTAL, FREET4, T3FREE, THYROIDAB in the last 72 hours. Anemia Panel: No results for input(s): VITAMINB12, FOLATE, FERRITIN, TIBC, IRON, RETICCTPCT in the last 72 hours. Urine analysis:    Component Value Date/Time   COLORURINE STRAW (A) 10/15/2021 2115   APPEARANCEUR CLEAR (A) 10/15/2021 2115   LABSPEC 1.026 10/15/2021 2115   PHURINE 5.0 10/15/2021 2115   GLUCOSEU >=500 (A) 10/15/2021 2115   HGBUR MODERATE (A) 10/15/2021 2115   BILIRUBINUR NEGATIVE 10/15/2021 2115   KETONESUR 80 (A) 10/15/2021 2115   PROTEINUR  100 (A) 10/15/2021 2115   NITRITE NEGATIVE 10/15/2021 2115   LEUKOCYTESUR NEGATIVE 10/15/2021 2115    Radiological Exams on Admission: CT ABDOMEN PELVIS WO CONTRAST  Result Date: 10/15/2021 CLINICAL DATA:  Right upper quadrant pain with nausea and vomiting for 3 days, initial encounter EXAM: CT ABDOMEN AND PELVIS WITHOUT CONTRAST TECHNIQUE: Multidetector CT imaging of the abdomen and pelvis was performed following the standard protocol without IV contrast. RADIATION DOSE REDUCTION: This exam was performed according to the departmental dose-optimization program which includes automated exposure control, adjustment of the mA and/or kV according to patient size and/or use of iterative reconstruction technique. COMPARISON:  None. FINDINGS: Lower chest: No acute abnormality.  Hepatobiliary: No focal liver abnormality is seen. No gallstones, gallbladder wall thickening, or biliary dilatation. Pancreas: Unremarkable. No pancreatic ductal dilatation or surrounding inflammatory changes. Spleen: Normal in size without focal abnormality. Adrenals/Urinary Tract: Adrenal glands are within normal limits. Kidneys demonstrate a normal appearance without renal calculi or obstructive changes. The ureters are within normal limits. Bladder is partially distended. Stomach/Bowel: The appendix is within normal limits. No obstructive or inflammatory changes of the colon are noted. Small bowel and stomach is within normal limits. Vascular/Lymphatic: No significant vascular findings are present. No enlarged abdominal or pelvic lymph nodes. Reproductive: Prostate is unremarkable. Other: No abdominal wall hernia or abnormality. No abdominopelvic ascites. Musculoskeletal: No acute or significant osseous findings. IMPRESSION: No acute abnormality noted. Electronically Signed   By: Inez Catalina M.D.   On: 10/15/2021 22:17      Assessment/Plan    Principal Problem:   DKA (diabetic ketoacidosis) ([place]) Active Problems:   Dehydration   AKI (acute kidney injury) ([place])   Epigastric pain   Nausea & vomiting   Leukocytosis      #) Diabetic ketoacidosis: In the setting of no known history of diabetes, dx of dka in setting of likely new dx of diabetes on basis of  the following laboratory eval: presenting blood sugar of 352 a/w presenting blood gas consistent with metabolic acidosis,  while CMP demonstrates anion gap metabolic acidosis with serum bicarbonate of 11 and AG of 20, and beta hydroxybutyric acid found to be elevated. Additional presenting labs notable for corrected serum sodium of 135 and serum potassium of 4.6.  In the context of no known history of underlying diabetes, patient affirms that he has not previously been on insulin.  No overt evidence of underlying precipitating infection,  including UA that is inconsistent with UTI, while COVID-19/influenza PCR have been found to be negative.  We will also check cxr. ACS is felt to be less likely, particular in the absence of any recent chest discomfort.  However, will check EKG as component of further evaluation of presenting DKA.  In the ED, insulin drip was initiated, and the following IVF administered: 2 L NS bolus followed by continuous NS.  as serum potassium level is less than 5, with most recent cbg > 250,  will transition existing IV fluids to 1/2NS with 10 mEq/L Kcl @ 125 cc/hr, with plan to transition to D5-1/2NS with 10 mEq/L Kcl, when cbg < 250.     Plan: DKA protocol initiated. Insulin drip per Endotool protocol. Q1H cbg's. Q4H BMP's in order to monitor ensuing AG, Na, and potassium. NPO.  IV fluids, as above. Check serum Mg and Phos levels, w/ prn supplementation per protocol. Monitor on telemetry. Monitor strict I's & O's.  Check A1c. An inpatient consult to diabetic educator has been placed.  Prn IV Zofran.  Prn IV fentanyl for abdominal pain. Check UDS, cxr, ekg for completeness of DKA work-up. In setting of new dx of diabetes: check C-peptide to evaluate endogenous insulin production. Check anti-beta islet cell ab's.       #) Acute kidney injury: Presenting elevated serum creatinine of 1.54 is presumed to represent acute kidney injury in the setting of dehydration as a consequence of DKA, although no prior serum creatinine data point available for point comparison to confirm that this represents an acute elevation relative to baseline.  Urinalysis, with results as above, demonstrating no evidence of urinary casts.  Plan: IV fluids, as above.  Monitor strict I's and O's and daily weights.  Trending of BMP, as outlined above.  Add on random urine sodium as well as random urine creatinine.       #) Nausea/vomiting: Intermittent nausea resulting in 2-3 daily episodes of nonbloody, nonbilious emesis over the last 3 to  4 days, which appears to be as a consequence of presenting DKA, as above.  CT abdomen/pelvis showed no evidence of acute intra-abdominal process, while labs demonstrate no evidence of transaminitis, and lipase is found to be nonelevated.  UA not suggestive of UTI.  Will also check chest x-ray.  Plan: Further evaluation management of presenting DKA, as above, including plan for IV fluids as outlined above.  Prn IV Zofran.  Add on serum magnesium level.  Check chest x-ray.  Check urinary drug screen.       #) Leukocytosis: Mildly elevated white blood cell count of 10,700.  No evidence of underlying infectious etiology at this time, including UA which is not suggestive of UTI, while COVID-19/influenza PCR were negative.  Also check chest x-ray.  Criteria for sepsis not met.  Rather, suspect mildly elevated white blood cell count on the basis of IMO concentration in the setting of dehydration as well as element of inflammation in the setting of presenting nausea/vomiting.  Plan: Check chest x-ray, as above.  Repeat CBC with differential morning.  IV fluids as above.     DVT prophylaxis: SCD's   Code Status: Full code Family Communication: Patient's case was discussed with his mother, who is present at bedside Disposition Plan: Per Rounding Team Consults called: none;  Admission status: Observation   PLEASE NOTE THAT DRAGON DICTATION SOFTWARE WAS USED IN THE CONSTRUCTION OF THIS NOTE.   [place] DO [facility] [place]   10/16/2021, 12:58 AM

## 2021-10-16 NOTE — ED Notes (Signed)
Pharmacy notified regarding need for maintenance infusion

## 2021-10-16 NOTE — TOC Initial Note (Signed)
Transition of Care [facility]) - Initial/Assessment Note    Patient Details  Name: Mark Mayer MRN: [id number] Date of Birth: Mar 02, 1991  [facility] CM/SW Contact:    Shelbie Hutching, RN Phone Number: 10/16/2021, 12:28 PM  Clinical Narrative:                 Patient admitted to the hospital with DKA, newly diagnosed diabetes this admission.  RNCM attempted to speak with patient in the emergency room, patient is lethargic and evasive and is not engaged.  He does say that he lives in [place] and he will be going back there at discharge and his father will be picking up in a car and transporting him.  He does not say how or why he is here in [place].   Informed patient that he will need to follow up with a PCP in [facility] and get set up with a pharmacy.  His prescriptions at discharge can be obtained from [facility] but he will not be able to use them again as he is not from [facility].    TOC will cont to follow.  Glucometer can also be gotten from [facility] at DC.   Expected Discharge Plan: Home/Self Care Barriers to Discharge: Continued Medical Work up   Patient Goals and CMS Choice Patient states their goals for this hospitalization and ongoing recovery are:: Patient would not state goals - lethargic and not wanting to answer      Expected Discharge Plan and Services Expected Discharge Plan: Home/Self Care   Discharge Planning Services: CM Consult                     DME Arranged: N/A DME Agency: NA       HH Arranged: NA [place] Agency: NA        Prior Living Arrangements/Services     Patient language and need for interpreter reviewed:: Yes Do you feel safe going back to the place where you live?: Yes      Need for Family Participation in Patient Care: Yes (Comment) Care giver support system in place?: Yes (comment) (mother and father)   Criminal Activity/Legal Involvement Pertinent to Current Situation/Hospitalization: No  - Comment as needed  Activities of Daily Living      Permission Sought/Granted Permission sought to share information with : Case Manager, Family Supports Permission granted to share information with : Yes, Verbal Permission Granted  Share Information with NAME: Silvestre Mesi     Permission granted to share info w Relationship: mother  Permission granted to share info w Contact Information: [phone]  Emotional Assessment Appearance:: Appears stated age Attitude/Demeanor/Rapport: Lethargic Affect (typically observed): Defensive, Quiet, Guarded Orientation: : Oriented to Self, Oriented to Place, Oriented to Situation, Oriented to  Time Alcohol / Substance Use: Not Applicable Psych Involvement: No (comment)  Admission diagnosis:  DKA (diabetic ketoacidosis) ([place]) [E11.10] Patient Active Problem List   Diagnosis Date Noted   Dehydration 10/16/2021   AKI (acute kidney injury) ([place]) 10/16/2021   Epigastric pain 10/16/2021   Nausea & vomiting 10/16/2021   Leukocytosis 10/16/2021   DKA (diabetic ketoacidosis) ([place]) 10/15/2021   PCP:  Merryl Hacker, No Pharmacy:   [facility] [address], [place] [address] Phone: [phone] Fax: [phone]     Social Determinants of Health (SDOH) Interventions    Readmission Risk Interventions No flowsheet data found.

## 2021-10-16 NOTE — Progress Notes (Addendum)
Inpatient Diabetes Program Recommendations  AACE/ADA: New Consensus Statement on Inpatient Glycemic Control (2015)  Target Ranges:  Prepandial:   less than 140 mg/dL      Peak postprandial:   less than 180 mg/dL (1-2 hours)      Critically ill patients:  140 - 180 mg/dL    Latest Reference Range & Units 10/15/21 19:36  Sodium 135 - 145 mmol/L 131 (L)  Potassium 3.5 - 5.1 mmol/L 4.6  Chloride 98 - 111 mmol/L 100  CO2 22 - 32 mmol/L 11 (L)  Glucose 70 - 99 mg/dL 352 (H)  BUN 6 - 20 mg/dL 14  Creatinine 0.61 - 1.24 mg/dL 1.54 (H)  Calcium 8.9 - 10.3 mg/dL 8.6 (L)  Anion gap 5 - 15  20 (H)    Latest Reference Range & Units 10/15/21 19:36  Beta-Hydroxybutyric Acid 0.05 - 0.27 mmol/L >8.00 (H)    Latest Reference Range & Units 10/15/21 23:45 10/16/21 00:57 10/16/21 01:53 10/16/21 02:54 10/16/21 03:53 10/16/21 04:57 10/16/21 05:59  Glucose-Capillary 70 - 99 mg/dL 276 (H)  IV Insulin Drip Started @2352  253 (H) 208 (H) 180 (H) 208 (H) 236 (H) 255 (H)     Admit with: DKA with New Onset Diabetes  Strong family history of diabetes  Current Orders: IV Insulin Drip   No Insurance listed No PCP listed TOC consult has been placed    MD- Note 3:55am BMET shows Anion Gap still elevated at 17 and CO2 level still low at 10 Next BMET yet to be collected  Please leave pt on the IV Insulin Drip until Anion Gap 12 or less and CO2 20 or higher  When you allow pt to transition to SQ Insulin, please make sure to have RN continue the IV Insulin drip for 2 hours after the basal insulin on board  Given age of pt, should we check GAD antibody levels to rule out Type 1 diabetes?    Addendum 12pm--Came to ED to see pt.  Pt very lethargic and unable to keep eyes open to have conversation.  Conversation mostly had with Mom who was at bedside.    Reviewed the following with Mom: Diagnosis of DKA (pathophysiology), treatment of DKA, lab results, and transition plan to SQ insulin  regimen.  Explained what an A1C is (results pending), basic pathophysiology of DM Type 2, basic home care for glucose control (including medications, nutrition, and exercise), basic diabetes diet nutrition principles, importance of checking CBGs and maintaining good CBG control to prevent long-term and short-term complications.    Educated patient's Mom on insulin pen use at home.  Reviewed all steps of insulin pen including attachment of needle, 2-unit air shot, dialing up dose, giving injection, rotation of injection sites, removing needle, disposal of sharps, storage of unused insulin, disposal of insulin etc.  Patient's Mom able to provide successful return demonstration.  Reviewed troubleshooting with insulin pen.    RNs to provide ongoing basic DM education at bedside with this patient.  Have ordered educational booklet, insulin starter kit.  Have placed TOC consult as pt does NOT have PCP nor insurance.  Have also placed RD consult for DM diet education for this patient.  Plan to re-visit pt and Mom tomorrow for further education.  Mom states she will not leave hospital until pt discharged.  Plan to re-visit tomorrow prior to 12pm--Mom appreciative of visit and plans to be here in hospital all day.     --Will follow patient during hospitalization--  Wyn Quaker  RN, MSN, CDE Diabetes Coordinator Inpatient Glycemic Control Team Team Pager: [phone] (8a-5p)     .dmedu

## 2021-10-17 ENCOUNTER — Inpatient Hospital Stay: Payer: Self-pay

## 2021-10-17 LAB — BASIC METABOLIC PANEL
Anion gap: 12 (ref 5–15)
Anion gap: 11 (ref 5–15)
BUN: 9 mg/dL (ref 6–20)
BUN: 8 mg/dL (ref 6–20)
CO2: 15 mmol/L — ABNORMAL LOW (ref 22–32)
CO2: 18 mmol/L — ABNORMAL LOW (ref 22–32)
Calcium: 8.6 mg/dL — ABNORMAL LOW (ref 8.9–10.3)
Calcium: 8.3 mg/dL — ABNORMAL LOW (ref 8.9–10.3)
Chloride: 104 mmol/L (ref 98–111)
Chloride: 106 mmol/L (ref 98–111)
Creatinine, Ser: 1.22 mg/dL (ref 0.61–1.24)
Creatinine, Ser: 1.09 mg/dL (ref 0.61–1.24)
GFR, Estimated: >60 mL/min (ref >60)
GFR, Estimated: >60 mL/min (ref >60)
Glucose, Bld: 325 mg/dL — ABNORMAL HIGH (ref 70–99)
Glucose, Bld: 192 mg/dL — ABNORMAL HIGH (ref 70–99)
Potassium: 3.6 mmol/L (ref 3.5–5.1)
Potassium: 3.3 mmol/L — ABNORMAL LOW (ref 3.5–5.1)
Sodium: 131 mmol/L — ABNORMAL LOW (ref 135–145)
Sodium: 135 mmol/L (ref 135–145)

## 2021-10-17 LAB — GLUCOSE, CAPILLARY
Glucose-Capillary: 317 mg/dL — ABNORMAL HIGH (ref 70–99)
Glucose-Capillary: 320 mg/dL — ABNORMAL HIGH (ref 70–99)
Glucose-Capillary: 241 mg/dL — ABNORMAL HIGH (ref 70–99)
Glucose-Capillary: 191 mg/dL — ABNORMAL HIGH (ref 70–99)

## 2021-10-17 LAB — C-PEPTIDE: C-Peptide: 0.3 ng/mL — ABNORMAL LOW (ref 1.1–4.4)

## 2021-10-17 LAB — HEMOGLOBIN A1C
Hgb A1c MFr Bld: 14.7 % — ABNORMAL HIGH (ref 4.8–5.6)
Mean Plasma Glucose: 375 mg/dL

## 2021-10-17 LAB — BETA-HYDROXYBUTYRIC ACID
Beta-Hydroxybutyric Acid: 4.39 mmol/L — ABNORMAL HIGH (ref 0.05–0.27)
Beta-Hydroxybutyric Acid: 3.83 mmol/L — ABNORMAL HIGH (ref 0.05–0.27)

## 2021-10-17 LAB — ANTI-ISLET CELL ANTIBODY: Pancreatic Islet Cell Antibody: NEGATIVE

## 2021-10-17 MED ORDER — INSULIN GLARGINE-YFGN 100 UNIT/ML ~~LOC~~ SOLN
30.0000 [IU] | Freq: Every day | SUBCUTANEOUS | Status: DC
  Administered 2021-10-18: 30 [IU] via SUBCUTANEOUS
  Filled 2021-10-17 (×2): qty 0.3

## 2021-10-17 MED ORDER — PROCHLORPERAZINE EDISYLATE 10 MG/2ML IJ SOLN
10.0000 mg | INTRAMUSCULAR | Status: DC | PRN
  Filled 2021-10-17: qty 2

## 2021-10-17 MED ORDER — SENNOSIDES-DOCUSATE SODIUM 8.6-50 MG PO TABS
1.0000 | ORAL_TABLET | Freq: Two times a day (BID) | ORAL | Status: DC
  Administered 2021-10-17 – 2021-10-18 (×3): 1 via ORAL
  Filled 2021-10-17 (×3): qty 1

## 2021-10-17 MED ORDER — ALUM & MAG HYDROXIDE-SIMETH 200-200-20 MG/5ML PO SUSP
30.0000 mL | ORAL | Status: DC | PRN
  Administered 2021-10-17: 30 mL via ORAL
  Filled 2021-10-17: qty 30

## 2021-10-17 MED ORDER — INSULIN GLARGINE-YFGN 100 UNIT/ML ~~LOC~~ SOLN
5.0000 [IU] | Freq: Once | SUBCUTANEOUS | Status: AC
  Administered 2021-10-17: 18:00:00 5 [IU] via SUBCUTANEOUS
  Filled 2021-10-17: qty 0.05

## 2021-10-17 MED ORDER — ENOXAPARIN SODIUM 60 MG/0.6ML IJ SOSY
0.5000 mg/kg | PREFILLED_SYRINGE | INTRAMUSCULAR | Status: DC
  Filled 2021-10-17: qty 0.6

## 2021-10-17 MED ORDER — SIMETHICONE 80 MG PO CHEW
80.0000 mg | CHEWABLE_TABLET | Freq: Four times a day (QID) | ORAL | Status: DC
  Administered 2021-10-17 – 2021-10-18 (×4): 80 mg via ORAL
  Filled 2021-10-17 (×8): qty 1

## 2021-10-17 MED ORDER — FAMOTIDINE IN NACL 20-0.9 MG/50ML-% IV SOLN
20.0000 mg | Freq: Two times a day (BID) | INTRAVENOUS | Status: DC
  Administered 2021-10-17 – 2021-10-18 (×3): 20 mg via INTRAVENOUS
  Filled 2021-10-17 (×4): qty 50

## 2021-10-17 MED ORDER — INSULIN GLARGINE-YFGN 100 UNIT/ML ~~LOC~~ SOLN
25.0000 [IU] | Freq: Every day | SUBCUTANEOUS | Status: DC
  Administered 2021-10-17: 25 [IU] via SUBCUTANEOUS
  Filled 2021-10-17 (×2): qty 0.25

## 2021-10-17 MED ORDER — INSULIN ASPART 100 UNIT/ML IJ SOLN
0.0000 [IU] | INTRAMUSCULAR | Status: DC
  Administered 2021-10-17: 5 [IU] via SUBCUTANEOUS
  Administered 2021-10-17 – 2021-10-18 (×2): 3 [IU] via SUBCUTANEOUS
  Administered 2021-10-18: 8 [IU] via SUBCUTANEOUS
  Administered 2021-10-18: 3 [IU] via SUBCUTANEOUS
  Administered 2021-10-18: 5 [IU] via SUBCUTANEOUS
  Filled 2021-10-17 (×7): qty 1

## 2021-10-17 NOTE — Plan of Care (Signed)
°  RD consulted for nutrition education regarding diabetes.   Lab Results  Component Value Date   HGBA1C 14.7 (H) 10/16/2021   Spoke with pt and mother at bedside. Pt asleep and mom did most of the talking. Pt shares that he does not feel well and is hoping his nausea resolves soon so he can eat. Pt mother reports concern that pt does not understand the gravity of his diagnosis and is only concerned about going home. Pt shares "I don't drink alcohol or smoke cigarettes, how am I still sick?". Mom reports her family has a strong history of diabetes and she plans on moving in with pt to help support him after discharge.   Pt consumed a diet high in carbohydrates, mainly noodles and fruit juices. Pt shares the only vegetables he eats are the ones that are served with alfredo. Per mom, pt did eat a wide variety of foods and vegetables when he was young, but has not done so since he moved out on his own.   RD reviewed DM self-management with pt and mother; discussed importance of decreasing simple sugars in diet and how to make healthier food and beverage choices, importance of taking medications as prescribed, checking blood sugars, and follow-up with PCP regularly. Affirmed pt and mother's feelings and provided emotional support. Provided supportive presence and acknowledged mom for support her son.   RD provided "Carbohydrate Counting for People with Diabetes" and "Plate Method" handouts from the Academy of Nutrition and Dietetics. Discussed different food groups and their effects on blood sugar, emphasizing carbohydrate-containing foods. Provided list of carbohydrates and recommended serving sizes of common foods.  Discussed importance of controlled and consistent carbohydrate intake throughout the day. Provided examples of ways to balance meals/snacks and encouraged intake of high-fiber, whole grain complex carbohydrates. Teach back method used.  Expect fair to good compliance.  Body mass index is  38.74 kg/m. Pt meets criteria for obesity, class II based on current BMI. Obesity is a complex, chronic medical condition that is optimally managed by a multidisciplinary care team. Weight loss is not an ideal goal for an acute inpatient hospitalization. However, if further work-up for obesity is warranted, consider outpatient referral to outpatient bariatric service and/or [facility].    Current diet order is carb modified, patient is consuming approximately n/a% of meals at this time. Labs and medications reviewed. No further nutrition interventions warranted at this time. RD contact information provided. If additional nutrition issues arise, please re-consult RD.  Levada Schilling, RD, LDN, CDCES Registered Dietitian II Certified Diabetes Care and Education Specialist Please refer to [facility] for RD and/or RD on-call/weekend/after hours pager

## 2021-10-17 NOTE — Progress Notes (Addendum)
Inpatient Diabetes Program Recommendations  AACE/ADA: New Consensus Statement on Inpatient Glycemic Control (2015)  Target Ranges:  Prepandial:   less than 140 mg/dL      Peak postprandial:   less than 180 mg/dL (1-2 hours)      Critically ill patients:  140 - 180 mg/dL    Latest Reference Range & Units 10/17/21 04:53  Sodium 135 - 145 mmol/L 131 (L)  Potassium 3.5 - 5.1 mmol/L 3.6  Chloride 98 - 111 mmol/L 104  CO2 22 - 32 mmol/L 15 (L)  Glucose 70 - 99 mg/dL 325 (H)  BUN 6 - 20 mg/dL 9  Creatinine 0.61 - 1.24 mg/dL 1.22  Calcium 8.9 - 10.3 mg/dL 8.6 (L)  Anion gap 5 - 15  12    Latest Reference Range & Units 10/17/21 07:57  Glucose-Capillary 70 - 99 mg/dL 317 (H)  11 units Novolog   25 units Semglee @1030     Admit with: DKA with New Onset Diabetes   Strong family history of diabetes   Current Orders: IV Insulin Drip     No Insurance listed No PCP listed TOC consult has been placed--Per TOC team, pt can get CBG meter and Insulin from Medication Management clinic at time of discharge  Note CO2 on BMET this AM was only 15.  Pt told me he threw up apple sauce this AM and then when I was in the room around 12pm, pt ate pineapple and immediately began throwing up (threw up 3 times within 2 minutes).  Messaged MD via [place] and let him know above info--Decision made to get KUB and Beta-Hydroxbutyric acid level.  RN caring for pt attached to [facility] as well and aware of plan.  Addendum 2:34pm--Beta-Hydroxybutyric acid level resulted at 4.39.  [place] sent to Dr. Priscella Mann about this level.  MD restarted IVF and plans to adjust SQ insulin further and trend Beta-Hydroxybutyric acid levels.  RN is attached to this [place] message and should be aware of the info between this Diabetes RN and the MD.    Before pt became sick in the room I was able to talk with him and his Mom about this new diagnosis (talked w/ Mom down in the ED yest as well).  Pt kept falling asleep during the conversation but Mom was  very attentive and asked many good questions.  Per Mom and pt, Mom plans to move in with pt to help take care of him and help him with his meds as he gets adjusted to his new diagnosis.  Showed Mom the insulin pen yesterday and she was able to successfully provide return demo.  Will email Mom an insulin pen video for future reference as well.   Discussed with pt and Mom that we do not have a specific medication regimen in place yet for discharge, however, pt will need insulin for home given he is not producing much of his own insulin at this time.  Discussed with Mom and pt that pt's medication needs may/could change over time but that insulin will be needed to start.  Again stressed to Mom and pt the importance of regular follow up with PCP after d/c.  Again, discussed basic diabetes info with Mom and pt today (pt did not have good attention span but Mom was very involved).  A1c level still pending--Explained what an A1C is, basic pathophysiology of DM Type 2, basic home care, basic diabetes diet nutrition principles, importance of checking CBGs and maintaining good CBG control to prevent  long-term and short-term complications.  Reviewed signs and symptoms of hyperglycemia and hypoglycemia and how to treat hypoglycemia at home.  Also reviewed blood sugar goals and A1c goals for home.    RNs to provide ongoing basic DM education at bedside with this patient.  Have ordered educational booklet, insulin starter kit.  Have also placed RD consult for DM diet education for this patient.  --Will follow patient during hospitalization--  Wyn Quaker RN, MSN, CDE Diabetes Coordinator Inpatient Glycemic Control Team Team Pager: [phone] (8a-5p)

## 2021-10-17 NOTE — Progress Notes (Signed)
\ PROGRESS NOTE    Mark Mayer  IEP:[id number] DOB: 1990-11-24 DOA: 10/15/2021 PCP: Pcp, No    Brief Narrative:  31 y.o. male who denies any known prior medical history , who is admitted to [facility] on 10/15/2021 with DKA after presenting from home to [facility] ED complaining of nausea and vomiting.    In the absence of any known history of underlying diabetes, the patient presents with 3 to 4 days of nausea resulting in 2-3 daily episodes of nonbloody, nonbilious emesis over that timeframe, with most recent episode of emesis occurring just prior to presenting to [facility] ED today.  As a consequence, he notes very little p.o. intake over the last few days.  He also notes recent new onset polydipsia and polyuria.  Denies any associated dysuria, gross hematuria.   patient also reports 3 to 4 days progressive epigastric discomfort.  Describes this pain as sharp in nature, with radiation into the right upper abdominal quadrant.  Pain is intermittent in nature, with exacerbation with palpation over the abdomen.  Denies any preceding trauma.  Patient found to be in diabetic ketoacidosis.  Started on insulin GTT.  Anion gap closed.  Patient remains somewhat acidotic but is mentating clearly and otherwise stable.  Nausea vomiting and abdominal pain has resolved.  Tolerating p.o. intake.  Transition to subcutaneous insulin regimen.  Diabetes coordinator following.  Assessment & Plan:   Principal Problem:   DKA (diabetic ketoacidosis) ([place]) Active Problems:   Dehydration   AKI (acute kidney injury) ([place])   Epigastric pain   Nausea & vomiting   Leukocytosis  Diabetic ketoacidosis New onset diabetes mellitus Initially required insulin gtt., now weaned off Transition to subcutaneous regimen Intermittent N/V Suboptimal glucose control Plan: Semglee 25 units daily Moderate sliding scale Nightly coverage Carb modified diet Diabetes coordinator consult DC IV fluids  Acute kidney injury Suspected  dehydration in setting of DKA No point of comparison Improving over interval Plan: Encourage p.o. intake Daily BMP  Intractable nausea and vomiting Suspect secondary to diabetic ketoacidosis Now resolved Plan: Carb modified diet as tolerated As needed antiemetics  Class II obesity BMI 38 This complicates overall care and prognosis Plan: DM coordinator and RD consult     DVT prophylaxis: SCDs Code Status: Full Family Communication: Mother at bedside 2/23, 2/24 Disposition Plan: Status is: Inpatient Remains inpatient appropriate because: Diabetic ketoacidosis.  New onset diabetes mellitus.  Needs optimization of glycemic regimen prior to discharge.  Anticipate discharge 24 hours           Level of care: Med-Surg  Consultants:  None  Procedures:  None  Antimicrobials: None   Subjective: Seen and examined.  Sleeping but easily awakened.  Answers all questions appropriately  Objective: Vitals:   10/16/21 2045 10/17/21 0015 10/17/21 0500 10/17/21 0802  BP: (!) 152/93 (!) 150/99 133/89 (!) 155/98  Pulse: (!) 104 (!) 103 84 91  Resp: _0 Temp: 98.1 F (36.7 C) 97.7 F (36.5 C) (!) 97.5 F (36.4 C) 98.1 F (36.7 C)  TempSrc:      SpO2: 100% 99% 100% 100%  Weight:      Height:        Intake/Output Summary (Last 24 hours) at 10/17/2021 1037 Last data filed at 10/17/2021 0400 Gross per 24 hour  Intake 2450.02 ml  Output --  Net 2450.02 ml   Filed Weights   10/15/21 1930  Weight: 108.9 kg    Examination:  General exam: No acute distress Respiratory system:  Clear to auscultation. Respiratory effort normal. Cardiovascular system: S1-S2, RRR, no murmurs, no pedal edema Gastrointestinal system: Obese, soft, NT/ND, normal bowel sounds Central nervous system: Alert and oriented. No focal neurological deficits. Extremities: Symmetric 5 x 5 power. Skin: No rashes, lesions or ulcers Psychiatry: Judgement and insight appear normal. Mood &  affect appropriate.     Data Reviewed: I have personally reviewed following labs and imaging studies  CBC: Recent Labs  Lab 10/15/21 1936 10/16/21 0355  WBC 10.7* 13.6*  NEUTROABS  --  10.1*  HGB 15.7 14.7  HCT 47.2 44.5  MCV 84.1 84.3  PLT 295 159   Basic Metabolic Panel: Recent Labs  Lab 10/15/21 1936 10/15/21 2342 10/16/21 0355 10/16/21 0830 10/16/21 1400 10/17/21 0453  NA 131* 133* 136 133* 131* 131*  K 4.6 4.8 4.7 4.0 4.1 3.6  CL 100 102 109 109 107 104  CO2 11* 8* 10* 11* 13* 15*  GLUCOSE 352* 303* 230* 209* 266* 325*  BUN _0 CREATININE 1.54* 1.42* 1.46* 1.49* 1.44* 1.22  CALCIUM 8.6* 8.5* 8.2* 8.4* 8.6* 8.6*  MG 2.3  --  2.2  --   --   --   PHOS  --   --  2.3*  --   --   --    GFR: Estimated Creatinine Clearance: 104.3 mL/min (by C-G formula based on SCr of 1.22 mg/dL). Liver Function Tests: Recent Labs  Lab 10/15/21 1936 10/16/21 0355  AST 21 16  ALT 26 23  ALKPHOS 94 87  BILITOT 1.9* 1.0  PROT 7.9 7.8  ALBUMIN 4.1 3.8   Recent Labs  Lab 10/15/21 1936  LIPASE 27   No results for input(s): AMMONIA in the last 168 hours. Coagulation Profile: No results for input(s): INR, PROTIME in the last 168 hours. Cardiac Enzymes: No results for input(s): CKTOTAL, CKMB, CKMBINDEX, TROPONINI in the last 168 hours. BNP (last 3 results) No results for input(s): PROBNP in the last 8760 hours. HbA1C: No results for input(s): HGBA1C in the last 72 hours. CBG: Recent Labs  Lab 10/16/21 1054 10/16/21 1247 10/16/21 1630 10/16/21 2040 10/17/21 0757  GLUCAP 265* 239* 231* 345* 317*   Lipid Profile: No results for input(s): CHOL, HDL, LDLCALC, TRIG, CHOLHDL, LDLDIRECT in the last 72 hours. Thyroid Function Tests: No results for input(s): TSH, T4TOTAL, FREET4, T3FREE, THYROIDAB in the last 72 hours. Anemia Panel: No results for input(s): VITAMINB12, FOLATE, FERRITIN, TIBC, IRON, RETICCTPCT in the last 72 hours. Sepsis Labs: No results for  input(s): PROCALCITON, LATICACIDVEN in the last 168 hours.  Recent Results (from the past 240 hour(s))  Resp Panel by RT-PCR (Flu A&B, Covid) Nasopharyngeal Swab     Status: None   Collection Time: 10/15/21  7:36 PM   Specimen: Nasopharyngeal Swab; Nasopharyngeal(NP) swabs in vial transport medium  Result Value Ref Range Status   SARS Coronavirus 2 by RT PCR NEGATIVE NEGATIVE Final    Comment: (NOTE) SARS-CoV-2 target nucleic acids are NOT DETECTED.  The SARS-CoV-2 RNA is generally detectable in upper respiratory specimens during the acute phase of infection. The lowest concentration of SARS-CoV-2 viral copies this assay can detect is 138 copies/mL. A negative result does not preclude SARS-Cov-2 infection and should not be used as the sole basis for treatment or other patient management decisions. A negative result may occur with  improper specimen collection/handling, submission of specimen other than nasopharyngeal swab, presence of viral mutation(s) within the areas targeted by this assay, and inadequate  number of viral copies(<138 copies/mL). A negative result must be combined with clinical observations, patient history, and epidemiological information. The expected result is Negative.  Fact Sheet for Patients:  EntrepreneurPulse.com.au  Fact Sheet for Healthcare Providers:  IncredibleEmployment.be  This test is no t yet approved or cleared by the [place] FDA and  has been authorized for detection and/or diagnosis of SARS-CoV-2 by FDA under an Emergency Use Authorization (EUA). This EUA will remain  in effect (meaning this test can be used) for the duration of the COVID-19 declaration under Section 564(b)(1) of the Act, 21 U.S.C.section 360bbb-3(b)(1), unless the authorization is terminated  or revoked sooner.       Influenza A by PCR NEGATIVE NEGATIVE Final   Influenza B by PCR NEGATIVE NEGATIVE Final    Comment: (NOTE) The  Xpert Xpress SARS-CoV-2/FLU/RSV plus assay is intended as an aid in the diagnosis of influenza from Nasopharyngeal swab specimens and should not be used as a sole basis for treatment. Nasal washings and aspirates are unacceptable for Xpert Xpress SARS-CoV-2/FLU/RSV testing.  Fact Sheet for Patients: EntrepreneurPulse.com.au  Fact Sheet for Healthcare Providers: IncredibleEmployment.be  This test is not yet approved or cleared by the [place] FDA and has been authorized for detection and/or diagnosis of SARS-CoV-2 by FDA under an Emergency Use Authorization (EUA). This EUA will remain in effect (meaning this test can be used) for the duration of the COVID-19 declaration under Section 564(b)(1) of the Act, 21 U.S.C. section 360bbb-3(b)(1), unless the authorization is terminated or revoked.  Performed at [facility], [address], [place], [place] [address]          Radiology Studies: CT ABDOMEN PELVIS WO CONTRAST  Result Date: 10/15/2021 CLINICAL DATA:  Right upper quadrant pain with nausea and vomiting for 3 days, initial encounter EXAM: CT ABDOMEN AND PELVIS WITHOUT CONTRAST TECHNIQUE: Multidetector CT imaging of the abdomen and pelvis was performed following the standard protocol without IV contrast. RADIATION DOSE REDUCTION: This exam was performed according to the departmental dose-optimization program which includes automated exposure control, adjustment of the mA and/or kV according to patient size and/or use of iterative reconstruction technique. COMPARISON:  None. FINDINGS: Lower chest: No acute abnormality. Hepatobiliary: No focal liver abnormality is seen. No gallstones, gallbladder wall thickening, or biliary dilatation. Pancreas: Unremarkable. No pancreatic ductal dilatation or surrounding inflammatory changes. Spleen: Normal in size without focal abnormality. Adrenals/Urinary Tract: Adrenal glands are within normal  limits. Kidneys demonstrate a normal appearance without renal calculi or obstructive changes. The ureters are within normal limits. Bladder is partially distended. Stomach/Bowel: The appendix is within normal limits. No obstructive or inflammatory changes of the colon are noted. Small bowel and stomach is within normal limits. Vascular/Lymphatic: No significant vascular findings are present. No enlarged abdominal or pelvic lymph nodes. Reproductive: Prostate is unremarkable. Other: No abdominal wall hernia or abnormality. No abdominopelvic ascites. Musculoskeletal: No acute or significant osseous findings. IMPRESSION: No acute abnormality noted. Electronically Signed   By: Inez Catalina M.D.   On: 10/15/2021 22:17   DG Chest Port 1 View  Result Date: 10/16/2021 CLINICAL DATA:  Diabetic ketoacidosis. Nausea and vomiting for 3 days. Right upper quadrant. EXAM: PORTABLE CHEST 1 VIEW COMPARISON:  None. FINDINGS: The cardiomediastinal contours are normal. The lungs are clear. Pulmonary vasculature is normal. No consolidation, pleural effusion, or pneumothorax. No acute osseous abnormalities are seen. IMPRESSION: Negative portable AP view of the chest. Electronically Signed   By: Keith Rake M.D.   On: 10/16/2021  01:49        Scheduled Meds:  insulin aspart  0-15 Units Subcutaneous TID WC   insulin aspart  0-5 Units Subcutaneous QHS   insulin glargine-yfgn  25 Units Subcutaneous Daily   insulin starter kit- pen needles  1 kit Other Once   living well with diabetes book   Does not apply Once   metoCLOPramide (REGLAN) injection  10 mg Intravenous Q8H   Continuous Infusions:  lactated ringers with kcl 100 mL/hr at 10/17/21 0506   promethazine (PHENERGAN) injection (IM or IVPB) Stopped (10/16/21 1153)     LOS: 1 day      Sidney Ace, MD [facility]   If 7PM-7AM, please contact night-coverage  10/17/2021, 10:37 AM

## 2021-10-18 LAB — BASIC METABOLIC PANEL
Anion gap: 13 (ref 5–15)
BUN: 7 mg/dL (ref 6–20)
CO2: 21 mmol/L — ABNORMAL LOW (ref 22–32)
Calcium: 8.6 mg/dL — ABNORMAL LOW (ref 8.9–10.3)
Chloride: 101 mmol/L (ref 98–111)
Creatinine, Ser: 1.13 mg/dL (ref 0.61–1.24)
GFR, Estimated: >60 mL/min (ref >60)
Glucose, Bld: 169 mg/dL — ABNORMAL HIGH (ref 70–99)
Potassium: 2.8 mmol/L — ABNORMAL LOW (ref 3.5–5.1)
Sodium: 135 mmol/L (ref 135–145)

## 2021-10-18 LAB — GLUCOSE, CAPILLARY
Glucose-Capillary: 152 mg/dL — ABNORMAL HIGH (ref 70–99)
Glucose-Capillary: 187 mg/dL — ABNORMAL HIGH (ref 70–99)
Glucose-Capillary: 223 mg/dL — ABNORMAL HIGH (ref 70–99)
Glucose-Capillary: 287 mg/dL — ABNORMAL HIGH (ref 70–99)

## 2021-10-18 LAB — BETA-HYDROXYBUTYRIC ACID: Beta-Hydroxybutyric Acid: 3.52 mmol/L — ABNORMAL HIGH (ref 0.05–0.27)

## 2021-10-18 MED ORDER — INSULIN PEN NEEDLE 32G X 6 MM MISC
1.0000 | Freq: Once | 0 refills | Status: AC
  Filled 2021-10-18: qty 100, 100d supply, fill #0

## 2021-10-18 MED ORDER — PANTOPRAZOLE SODIUM 40 MG PO TBEC
40.0000 mg | DELAYED_RELEASE_TABLET | Freq: Every day | ORAL | 0 refills | Status: AC
  Filled 2021-10-18: qty 30, 30d supply, fill #0

## 2021-10-18 MED ORDER — BASAGLAR KWIKPEN 100 UNIT/ML ~~LOC~~ SOPN
30.0000 [IU] | PEN_INJECTOR | Freq: Every day | SUBCUTANEOUS | 0 refills | Status: AC
  Filled 2021-10-18: qty 15, 50d supply, fill #0

## 2021-10-18 MED ORDER — METFORMIN HCL 1000 MG PO TABS
1000.0000 mg | ORAL_TABLET | Freq: Two times a day (BID) | ORAL | 0 refills | Status: AC
  Filled 2021-10-18: qty 60, 30d supply, fill #0

## 2021-10-18 MED ORDER — RIGHTEST GM550 BLOOD GLUCOSE W/DEVICE KIT
PACK | 0 refills | Status: AC
  Filled 2021-10-18: qty 1, 30d supply, fill #0

## 2021-10-18 MED ORDER — POTASSIUM CHLORIDE CRYS ER 20 MEQ PO TBCR
40.0000 meq | EXTENDED_RELEASE_TABLET | ORAL | Status: AC
  Administered 2021-10-18 (×3): 40 meq via ORAL
  Filled 2021-10-18 (×2): qty 2

## 2021-10-18 MED ORDER — FAMOTIDINE 20 MG PO TABS
20.0000 mg | ORAL_TABLET | Freq: Two times a day (BID) | ORAL | Status: DC
  Filled 2021-10-18: qty 1

## 2021-10-18 MED ORDER — METOCLOPRAMIDE HCL 10 MG PO TABS
10.0000 mg | ORAL_TABLET | Freq: Three times a day (TID) | ORAL | 0 refills | Status: AC | PRN
  Filled 2021-10-18: qty 90, 30d supply, fill #0

## 2021-10-18 NOTE — TOC Progression Note (Addendum)
Transition of Care [facility]) - Progression Note    Patient Details  Name: Mark Mayer MRN: [id number] Date of Birth: 05/22/1991  Transition of Care [facility]) CM/SW Contact  Maree Krabbe, LCSW Phone Number: 10/18/2021, 1:25 PM  Clinical Narrative:  Pharmacy will provide meds to get pt through weekend and then pt will have to pick up meds from med management on Monday since they are closed on weekend. Pt will also have to pick up strips for glucometer at med management.   Pt's ride home is at bedside.  Expected Discharge Plan: Home/Self Care Barriers to Discharge: Continued Medical Work up  Expected Discharge Plan and Services Expected Discharge Plan: Home/Self Care   Discharge Planning Services: CM Consult                     DME Arranged: N/A DME Agency: NA       HH Arranged: NA HH Agency: NA         Social Determinants of Health (SDOH) Interventions    Readmission Risk Interventions No flowsheet data found.

## 2021-10-18 NOTE — Plan of Care (Signed)

## 2021-10-18 NOTE — Plan of Care (Signed)
°  Problem: Education: Goal: Knowledge of General Education information will improve Description: Including pain rating scale, medication(s)/side effects and non-pharmacologic comfort measures 10/18/2021 1446 by Amarii Amy, Jay Schlichter, RN Outcome: Adequate for Discharge 10/18/2021 1045 by Cormac Wint, Jay Schlichter, RN Outcome: Progressing   Problem: Health Behavior/Discharge Planning: Goal: Ability to manage health-related needs will improve 10/18/2021 1446 by Larose Batres, Jay Schlichter, RN Outcome: Adequate for Discharge 10/18/2021 1045 by Kostas Marrow, Jay Schlichter, RN Outcome: Progressing   Problem: Clinical Measurements: Goal: Ability to maintain clinical measurements within normal limits will improve 10/18/2021 1446 by Dajour Pierpoint, Jay Schlichter, RN Outcome: Adequate for Discharge 10/18/2021 1045 by Elia Nunley, Jay Schlichter, RN Outcome: Progressing Goal: Will remain free from infection 10/18/2021 1446 by Ashby Leflore, Jay Schlichter, RN Outcome: Adequate for Discharge 10/18/2021 1045 by Maelle Sheaffer, Jay Schlichter, RN Outcome: Progressing Goal: Diagnostic test results will improve 10/18/2021 1446 by Shabnam Ladd, Jay Schlichter, RN Outcome: Adequate for Discharge 10/18/2021 1045 by Sanjeev Main, Jay Schlichter, RN Outcome: Progressing Goal: Respiratory complications will improve 10/18/2021 1446 by Anastacio Bua, Jay Schlichter, RN Outcome: Adequate for Discharge 10/18/2021 1045 by Bradden Tadros, Jay Schlichter, RN Outcome: Progressing Goal: Cardiovascular complication will be avoided 10/18/2021 1446 by Susan Arana, Jay Schlichter, RN Outcome: Adequate for Discharge 10/18/2021 1045 by Quinnten Calvin, Jay Schlichter, RN Outcome: Progressing   Problem: Activity: Goal: Risk for activity intolerance will decrease 10/18/2021 1446 by Taurean Ju, Jay Schlichter, RN Outcome: Adequate for Discharge 10/18/2021 1045 by Pearle Wandler, Jay Schlichter, RN Outcome: Progressing   Problem: Nutrition: Goal: Adequate nutrition will be maintained 10/18/2021 1446 by Clifford Coudriet, Jay Schlichter, RN Outcome: Adequate for Discharge 10/18/2021 1045 by  Cranston Koors, Jay Schlichter, RN Outcome: Progressing   Problem: Coping: Goal: Level of anxiety will decrease 10/18/2021 1446 by Catlynn Grondahl, Jay Schlichter, RN Outcome: Adequate for Discharge 10/18/2021 1045 by Marx Doig, Jay Schlichter, RN Outcome: Progressing   Problem: Elimination: Goal: Will not experience complications related to bowel motility 10/18/2021 1446 by Carter Kitten, RN Outcome: Adequate for Discharge 10/18/2021 1045 by Jammy Stlouis, Jay Schlichter, RN Outcome: Progressing Goal: Will not experience complications related to urinary retention 10/18/2021 1446 by Amilliana Hayworth, Jay Schlichter, RN Outcome: Adequate for Discharge 10/18/2021 1045 by Raeshawn Vo, Jay Schlichter, RN Outcome: Progressing   Problem: Pain Managment: Goal: General experience of comfort will improve 10/18/2021 1446 by Sharel Behne, Jay Schlichter, RN Outcome: Adequate for Discharge 10/18/2021 1045 by Aylene Acoff, Jay Schlichter, RN Outcome: Progressing   Problem: Safety: Goal: Ability to remain free from injury will improve 10/18/2021 1446 by Kelvon Giannini, Jay Schlichter, RN Outcome: Adequate for Discharge 10/18/2021 1045 by Jantzen Pilger, Jay Schlichter, RN Outcome: Progressing   Problem: Skin Integrity: Goal: Risk for impaired skin integrity will decrease 10/18/2021 1446 by Saryiah Bencosme, Jay Schlichter, RN Outcome: Adequate for Discharge 10/18/2021 1045 by Nataleah Scioneaux, Jay Schlichter, RN Outcome: Progressing

## 2021-10-18 NOTE — Discharge Summary (Signed)
Physician Discharge Summary  Mark Mayer JEH:[id number] DOB: 08/08/1991 DOA: 10/15/2021  PCP: Pcp, No  Admit date: 10/15/2021 Discharge date: 10/18/2021  Admitted From: Home Disposition:  Home  Recommendations for Outpatient Follow-up:  Establish care with PCP   Home HealthNo Equipment/Devices:None   Discharge Condition:Stable  CODE STATUS:FULL  Diet recommendation: Carb modified  Brief/Interim Summary: 31 y.o. male who denies any known prior medical history , who is admitted to [facility] on 10/15/2021 with DKA after presenting from home to [facility] ED complaining of nausea and vomiting.    In the absence of any known history of underlying diabetes, the patient presents with 3 to 4 days of nausea resulting in 2-3 daily episodes of nonbloody, nonbilious emesis over that timeframe, with most recent episode of emesis occurring just prior to presenting to [facility] ED today.  As a consequence, he notes very little p.o. intake over the last few days.  He also notes recent new onset polydipsia and polyuria.  Denies any associated dysuria, gross hematuria.   patient also reports 3 to 4 days progressive epigastric discomfort.  Describes this pain as sharp in nature, with radiation into the right upper abdominal quadrant.  Pain is intermittent in nature, with exacerbation with palpation over the abdomen.  Denies any preceding trauma.   Patient found to be in diabetic ketoacidosis.  Started on insulin GTT.  Anion gap closed.  Patient remains somewhat acidotic but is mentating clearly and otherwise stable.  Nausea vomiting and abdominal pain has resolved.  Tolerating p.o. intake.  Transition to subcutaneous insulin regimen.  Diabetes coordinator following.  On day of discharge patient's anion gap is closed.  Nausea and vomiting resolved.  Patient had BM.  Had some stomach pain attributed to p.o. potassium replacement.  He is stable for discharge at this time.  Will discharge on Basaglar pens 30 units daily,  metformin 1000 mg p.o. twice daily, 3 times daily as needed Reglan, glucometer.  Medication sent to medication management pharmacy.  Unfortunately they are closed on the weekends and patient knows he needs to return to pick up his meds there on 2/27.    Discharge Diagnoses:  Principal Problem:   DKA (diabetic ketoacidosis) ([place]) Active Problems:   Dehydration   AKI (acute kidney injury) ([place])   Epigastric pain   Nausea & vomiting   Leukocytosis  Diabetic ketoacidosis New onset diabetes mellitus Initially required insulin gtt., now weaned off Transition to subcutaneous regimen Intermittent N/V Suboptimal glucose control Hemoglobin A1c 14.8, very poor control Plan: Discharge regimen as follows: Basaglar pens 30 units daily, metformin 1000 mg p.o. twice daily, 3 times daily as needed Reglan.  Prescription for glucometer sent to outpatient pharmacy.  Patient received diabetic education.  Supplemental information printed out and included in discharge packet.  Discharge home.  Patient instructed to establish care with a primary care physician as soon as possible.  1 month supply of medications provided.   Acute kidney injury Suspected dehydration in setting of DKA No point of comparison Improving over interval Plan: Encourage p.o. fluid intake after discharge   Intractable nausea and vomiting Suspect secondary to diabetic ketoacidosis Now resolved Plan: As needed Reglan provided on discharge   Class II obesity BMI 38 This complicates overall care and prognosis Plan: DM coordinator and RD consult  Discharge Instructions  Discharge Instructions     Diet - low sodium heart healthy   Complete by: As directed    Increase activity slowly   Complete by: As directed  Allergies as of 10/18/2021   Not on File      Medication List     TAKE these medications    Basaglar KwikPen 100 UNIT/ML Inject 30 Units into the skin daily.   blood glucose meter kit and supplies  Kit Dispense based on patient and insurance preference. Use up to four times daily as directed.   insulin starter kit- pen needles Misc 1 kit by Other route once for 1 dose.   metFORMIN 1000 MG tablet Commonly known as: GLUCOPHAGE Take 1 tablet (1,000 mg total) by mouth 2 (two) times daily with a meal.   metoCLOPramide 10 MG tablet Commonly known as: REGLAN Take 1 tablet (10 mg total) by mouth every 8 (eight) hours as needed for nausea.   pantoprazole 40 MG tablet Commonly known as: Protonix Take 1 tablet (40 mg total) by mouth daily.        Follow-up Information     [facility] Follow up.   Why: Call office to establish primary care if you would like a doctor in [place].  I have provided a 1 month supply of medications.  You need to see an outpatient doctor for refills Contact information: [place] [address] [phone]                Not on File  Consultations: DM coordinator   Procedures/Studies: CT ABDOMEN PELVIS WO CONTRAST  Result Date: 10/15/2021 CLINICAL DATA:  Right upper quadrant pain with nausea and vomiting for 3 days, initial encounter EXAM: CT ABDOMEN AND PELVIS WITHOUT CONTRAST TECHNIQUE: Multidetector CT imaging of the abdomen and pelvis was performed following the standard protocol without IV contrast. RADIATION DOSE REDUCTION: This exam was performed according to the departmental dose-optimization program which includes automated exposure control, adjustment of the mA and/or kV according to patient size and/or use of iterative reconstruction technique. COMPARISON:  None. FINDINGS: Lower chest: No acute abnormality. Hepatobiliary: No focal liver abnormality is seen. No gallstones, gallbladder wall thickening, or biliary dilatation. Pancreas: Unremarkable. No pancreatic ductal dilatation or surrounding inflammatory changes. Spleen: Normal in size without focal abnormality. Adrenals/Urinary Tract: Adrenal glands are  within normal limits. Kidneys demonstrate a normal appearance without renal calculi or obstructive changes. The ureters are within normal limits. Bladder is partially distended. Stomach/Bowel: The appendix is within normal limits. No obstructive or inflammatory changes of the colon are noted. Small bowel and stomach is within normal limits. Vascular/Lymphatic: No significant vascular findings are present. No enlarged abdominal or pelvic lymph nodes. Reproductive: Prostate is unremarkable. Other: No abdominal wall hernia or abnormality. No abdominopelvic ascites. Musculoskeletal: No acute or significant osseous findings. IMPRESSION: No acute abnormality noted. Electronically Signed   By: Inez Catalina M.D.   On: 10/15/2021 22:17   DG Abd 1 View  Result Date: 10/17/2021 CLINICAL DATA:  Nausea, vomiting EXAM: ABDOMEN - 1 VIEW COMPARISON:  None. FINDINGS: Nonobstructive bowel gas pattern. Mild colonic stool burden. No radiopaque calculi overlie the kidneys or course of the ureters. IMPRESSION: Negative abdominal radiographs. Electronically Signed   By: Maurine Simmering M.D.   On: 10/17/2021 13:20   DG Chest Port 1 View  Result Date: 10/16/2021 CLINICAL DATA:  Diabetic ketoacidosis. Nausea and vomiting for 3 days. Right upper quadrant. EXAM: PORTABLE CHEST 1 VIEW COMPARISON:  None. FINDINGS: The cardiomediastinal contours are normal. The lungs are clear. Pulmonary vasculature is normal. No consolidation, pleural effusion, or pneumothorax. No acute osseous abnormalities are seen. IMPRESSION: Negative portable AP view of the  chest. Electronically Signed   By: Keith Rake M.D.   On: 10/16/2021 01:49      Subjective: Seen and examined on day of discharge.  Stable no distress.  Nausea and vomiting resolved.  Constipation resolved.  Stable for discharge home.  Discharge Exam: Vitals:   10/18/21 0620 10/18/21 0740  BP: (!) 148/72 139/79  Pulse: 85 77  Resp: 16 17  Temp: 98.6 F (37 C) 98.5 F (36.9 C)   SpO2: 100% 100%   Vitals:   10/17/21 2036 10/18/21 0500 10/18/21 0620 10/18/21 0740  BP: (!) 158/96  (!) 148/72 139/79  Pulse: 89  85 77  Resp: 16  16 17   Temp: 99 F (37.2 C)  98.6 F (37 C) 98.5 F (36.9 C)  TempSrc: Oral  Oral Oral  SpO2: 100%  100% 100%  Weight:  109.9 kg    Height:        General: Pt is alert, awake, not in acute distress Cardiovascular: RRR, S1/S2 +, no rubs, no gallops Respiratory: CTA bilaterally, no wheezing, no rhonchi Abdominal: Soft, NT, ND, bowel sounds + Extremities: no edema, no cyanosis    The results of significant diagnostics from this hospitalization (including imaging, microbiology, ancillary and laboratory) are listed below for reference.     Microbiology: Recent Results (from the past 240 hour(s))  Resp Panel by RT-PCR (Flu A&B, Covid) Nasopharyngeal Swab     Status: None   Collection Time: 10/15/21  7:36 PM   Specimen: Nasopharyngeal Swab; Nasopharyngeal(NP) swabs in vial transport medium  Result Value Ref Range Status   SARS Coronavirus 2 by RT PCR NEGATIVE NEGATIVE Final    Comment: (NOTE) SARS-CoV-2 target nucleic acids are NOT DETECTED.  The SARS-CoV-2 RNA is generally detectable in upper respiratory specimens during the acute phase of infection. The lowest concentration of SARS-CoV-2 viral copies this assay can detect is 138 copies/mL. A negative result does not preclude SARS-Cov-2 infection and should not be used as the sole basis for treatment or other patient management decisions. A negative result may occur with  improper specimen collection/handling, submission of specimen other than nasopharyngeal swab, presence of viral mutation(s) within the areas targeted by this assay, and inadequate number of viral copies(<138 copies/mL). A negative result must be combined with clinical observations, patient history, and epidemiological information. The expected result is Negative.  Fact Sheet for Patients:   EntrepreneurPulse.com.au  Fact Sheet for Healthcare Providers:  IncredibleEmployment.be  This test is no t yet approved or cleared by the [place] FDA and  has been authorized for detection and/or diagnosis of SARS-CoV-2 by FDA under an Emergency Use Authorization (EUA). This EUA will remain  in effect (meaning this test can be used) for the duration of the COVID-19 declaration under Section 564(b)(1) of the Act, 21 U.S.C.section 360bbb-3(b)(1), unless the authorization is terminated  or revoked sooner.       Influenza A by PCR NEGATIVE NEGATIVE Final   Influenza B by PCR NEGATIVE NEGATIVE Final    Comment: (NOTE) The Xpert Xpress SARS-CoV-2/FLU/RSV plus assay is intended as an aid in the diagnosis of influenza from Nasopharyngeal swab specimens and should not be used as a sole basis for treatment. Nasal washings and aspirates are unacceptable for Xpert Xpress SARS-CoV-2/FLU/RSV testing.  Fact Sheet for Patients: EntrepreneurPulse.com.au  Fact Sheet for Healthcare Providers: IncredibleEmployment.be  This test is not yet approved or cleared by the [place] FDA and has been authorized for detection and/or diagnosis of SARS-CoV-2 by FDA under an  Emergency Use Authorization (EUA). This EUA will remain in effect (meaning this test can be used) for the duration of the COVID-19 declaration under Section 564(b)(1) of the Act, 21 U.S.C. section 360bbb-3(b)(1), unless the authorization is terminated or revoked.  Performed at [facility], [address] [place], [place] [address]      Labs: BNP (last 3 results) No results for input(s): BNP in the last 8760 hours. Basic Metabolic Panel: Recent Labs  Lab 10/15/21 1936 10/15/21 2342 10/16/21 0355 10/16/21 0830 10/16/21 1400 10/17/21 0453 10/17/21 1643 10/18/21 0716  NA 131*   < > 136 133* 131* 131* 135 135  K 4.6   < > 4.7 4.0 4.1  3.6 3.3* 2.8*  CL 100   < > 109 109 107 104 106 101  CO2 11*   < > 10* 11* 13* 15* 18* 21*  GLUCOSE 352*   < > 230* 209* 266* 325* 192* 169*  BUN 14   < > 12 11 10 9 8 7   CREATININE 1.54*   < > 1.46* 1.49* 1.44* 1.22 1.09 1.13  CALCIUM 8.6*   < > 8.2* 8.4* 8.6* 8.6* 8.3* 8.6*  MG 2.3  --  2.2  --   --   --   --   --   PHOS  --   --  2.3*  --   --   --   --   --    < > = values in this interval not displayed.   Liver Function Tests: Recent Labs  Lab 10/15/21 1936 10/16/21 0355  AST 21 16  ALT 26 23  ALKPHOS 94 87  BILITOT 1.9* 1.0  PROT 7.9 7.8  ALBUMIN 4.1 3.8   Recent Labs  Lab 10/15/21 1936  LIPASE 27   No results for input(s): AMMONIA in the last 168 hours. CBC: Recent Labs  Lab 10/15/21 1936 10/16/21 0355  WBC 10.7* 13.6*  NEUTROABS  --  10.1*  HGB 15.7 14.7  HCT 47.2 44.5  MCV 84.1 84.3  PLT 295 261   Cardiac Enzymes: No results for input(s): CKTOTAL, CKMB, CKMBINDEX, TROPONINI in the last 168 hours. BNP: Invalid input(s): POCBNP CBG: Recent Labs  Lab 10/17/21 2035 10/18/21 0027 10/18/21 0622 10/18/21 0740 10/18/21 1202  GLUCAP 191* 223* 152* 187* 287*   D-Dimer No results for input(s): DDIMER in the last 72 hours. Hgb A1c Recent Labs    10/16/21 0355  HGBA1C 14.7*   Lipid Profile No results for input(s): CHOL, HDL, LDLCALC, TRIG, CHOLHDL, LDLDIRECT in the last 72 hours. Thyroid function studies No results for input(s): TSH, T4TOTAL, T3FREE, THYROIDAB in the last 72 hours.  Invalid input(s): FREET3 Anemia work up No results for input(s): VITAMINB12, FOLATE, FERRITIN, TIBC, IRON, RETICCTPCT in the last 72 hours. Urinalysis    Component Value Date/Time   COLORURINE STRAW (A) 10/15/2021 2115   APPEARANCEUR CLEAR (A) 10/15/2021 2115   LABSPEC 1.026 10/15/2021 2115   PHURINE 5.0 10/15/2021 2115   GLUCOSEU >=500 (A) 10/15/2021 2115   HGBUR MODERATE (A) 10/15/2021 2115   BILIRUBINUR NEGATIVE 10/15/2021 2115   KETONESUR 80 (A) 10/15/2021  2115   PROTEINUR 100 (A) 10/15/2021 2115   NITRITE NEGATIVE 10/15/2021 2115   LEUKOCYTESUR NEGATIVE 10/15/2021 2115   Sepsis Labs Invalid input(s): PROCALCITONIN,  WBC,  LACTICIDVEN Microbiology Recent Results (from the past 240 hour(s))  Resp Panel by RT-PCR (Flu A&B, Covid) Nasopharyngeal Swab     Status: None   Collection Time: 10/15/21  7:36  PM   Specimen: Nasopharyngeal Swab; Nasopharyngeal(NP) swabs in vial transport medium  Result Value Ref Range Status   SARS Coronavirus 2 by RT PCR NEGATIVE NEGATIVE Final    Comment: (NOTE) SARS-CoV-2 target nucleic acids are NOT DETECTED.  The SARS-CoV-2 RNA is generally detectable in upper respiratory specimens during the acute phase of infection. The lowest concentration of SARS-CoV-2 viral copies this assay can detect is 138 copies/mL. A negative result does not preclude SARS-Cov-2 infection and should not be used as the sole basis for treatment or other patient management decisions. A negative result may occur with  improper specimen collection/handling, submission of specimen other than nasopharyngeal swab, presence of viral mutation(s) within the areas targeted by this assay, and inadequate number of viral copies(<138 copies/mL). A negative result must be combined with clinical observations, patient history, and epidemiological information. The expected result is Negative.  Fact Sheet for Patients:  EntrepreneurPulse.com.au  Fact Sheet for Healthcare Providers:  IncredibleEmployment.be  This test is no t yet approved or cleared by the [place] FDA and  has been authorized for detection and/or diagnosis of SARS-CoV-2 by FDA under an Emergency Use Authorization (EUA). This EUA will remain  in effect (meaning this test can be used) for the duration of the COVID-19 declaration under Section 564(b)(1) of the Act, 21 U.S.C.section 360bbb-3(b)(1), unless the authorization is terminated  or  revoked sooner.       Influenza A by PCR NEGATIVE NEGATIVE Final   Influenza B by PCR NEGATIVE NEGATIVE Final    Comment: (NOTE) The Xpert Xpress SARS-CoV-2/FLU/RSV plus assay is intended as an aid in the diagnosis of influenza from Nasopharyngeal swab specimens and should not be used as a sole basis for treatment. Nasal washings and aspirates are unacceptable for Xpert Xpress SARS-CoV-2/FLU/RSV testing.  Fact Sheet for Patients: EntrepreneurPulse.com.au  Fact Sheet for Healthcare Providers: IncredibleEmployment.be  This test is not yet approved or cleared by the [place] FDA and has been authorized for detection and/or diagnosis of SARS-CoV-2 by FDA under an Emergency Use Authorization (EUA). This EUA will remain in effect (meaning this test can be used) for the duration of the COVID-19 declaration under Section 564(b)(1) of the Act, 21 U.S.C. section 360bbb-3(b)(1), unless the authorization is terminated or revoked.  Performed at [facility], [address], [place], [place] [address]      Time coordinating discharge: Over 30 minutes  SIGNED:   Sidney Ace, MD  [facility] 10/18/2021, 1:46 PM Pager   If 7PM-7AM, please contact night-coverage

## 2021-10-20 ENCOUNTER — Other Ambulatory Visit: Payer: Self-pay

## 2021-10-20 LAB — GLUTAMIC ACID DECARBOXYLASE AUTO ABS: Glutamic Acid Decarb Ab: <5 U/mL (ref 0.0–5.0)

## 2021-10-20 MED ORDER — RIGHTEST GS550 BLOOD GLUCOSE VI STRP
ORAL_STRIP | 0 refills | Status: AC
  Filled 2021-10-20: qty 50, 13d supply, fill #0

## 2021-10-20 MED ORDER — RIGHTEST GL300 LANCETS MISC
0 refills | Status: AC
Start: 2021-10-20 — End: ?
  Filled 2021-10-20: qty 100, 25d supply, fill #0

## 2021-10-21 ENCOUNTER — Other Ambulatory Visit: Payer: Self-pay

## 2021-11-20 ENCOUNTER — Other Ambulatory Visit: Payer: Self-pay

## 2021-11-20 ENCOUNTER — Telehealth: Payer: Self-pay | Admitting: Pharmacy Technician

## 2021-11-20 NOTE — Telephone Encounter (Signed)
Received Patient Intake Application.  The address on the application is [address] [place] [address].  Phone number on application belongs to sister of patient, [place].  Sister stated that patient resides at her residence 75% of time.  Stays with Mom in [place] and lives with girlfriend sometimes who lives in [place].  Provided [place] contact information for Medication Assistance Programs in [facility] [phone] & [facility] in [phone].  Also, provided [place] with contact information for [facility]. ? ?[place] stated that she is trying to connect patient to a provider at [facility].  Will reach out to one of the medication assistance agencies closer to her home. ? ?Sherilyn Dacosta ?Care Manager ?[facility] ?

## 2021-11-27 ENCOUNTER — Telehealth: Payer: Self-pay | Admitting: Pharmacist

## 2021-11-27 NOTE — Telephone Encounter (Signed)
Patient is not an [facility] resident and does not meet the eligibility requirements to obtain medication assistance from [facility]. Patient notified by letter. ?Mark Mayer ?Environmental health practitioner ?[facility] ?

## 2022-03-24 DEATH — deceased

## 2023-11-11 IMAGING — CR DG ABDOMEN 1V
1 series · 2 of 2 positions shown · non-contrast
Comparison: None.

CLINICAL DATA: Nausea, vomiting

EXAM:
ABDOMEN - 1 VIEW

[Series 1: dg abd 1 view · 0.14mm/px · 2 of 2 slices shown]
[im 1/2]
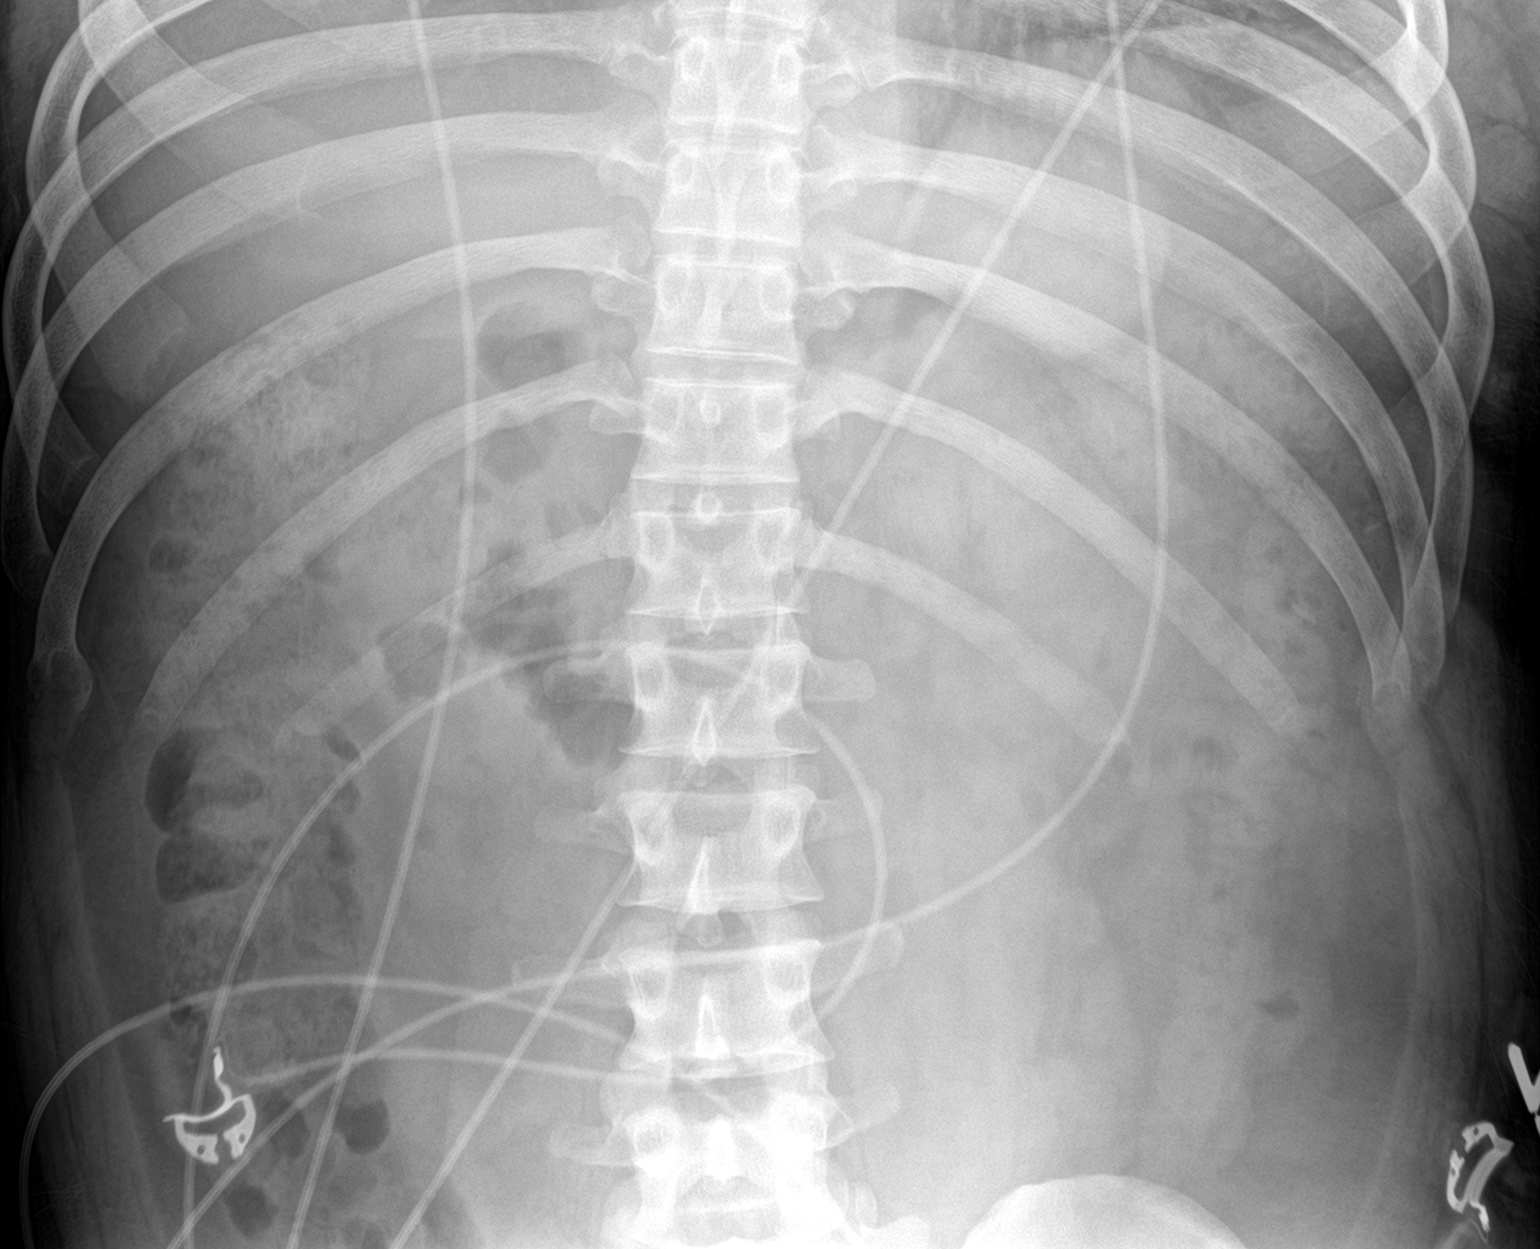
[im 2/2]
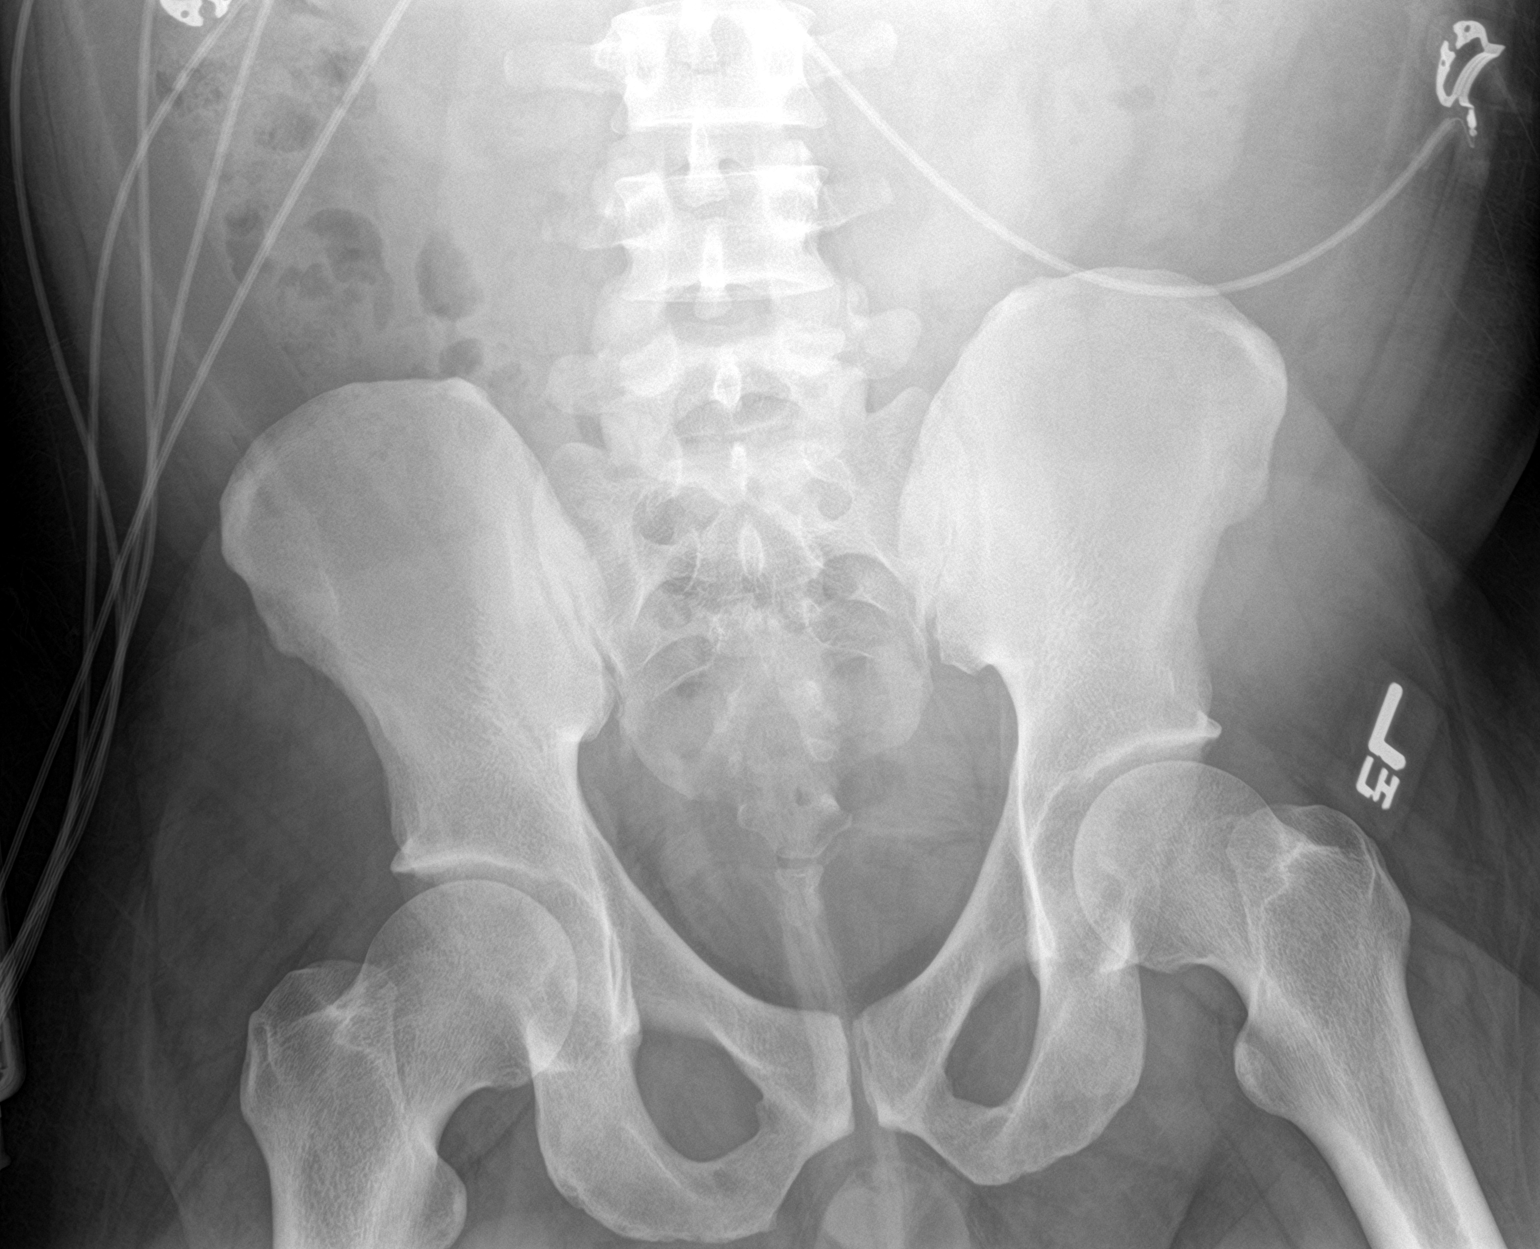

[2 of 2 positions shown; findings below may reference images not displayed]

FINDINGS: Nonobstructive bowel gas pattern. Mild colonic stool burden. No
radiopaque calculi overlie the kidneys or course of the ureters.
IMPRESSION: Negative abdominal radiographs.
